# Patient Record
Sex: Male | Born: 1935 | Race: White | Hispanic: No | Smoking: Never smoker
Health system: Southern US, Community
[De-identification: ages and names within clinical notes are randomized; demographics above are authoritative.]

## PROBLEM LIST (undated history)

## (undated) DIAGNOSIS — E079 Disorder of thyroid, unspecified: Secondary | ICD-10-CM

## (undated) DIAGNOSIS — K219 Gastro-esophageal reflux disease without esophagitis: Secondary | ICD-10-CM

## (undated) DIAGNOSIS — I714 Abdominal aortic aneurysm, without rupture, unspecified: Secondary | ICD-10-CM

## (undated) DIAGNOSIS — F0280 Dementia in other diseases classified elsewhere without behavioral disturbance: Secondary | ICD-10-CM

## (undated) DIAGNOSIS — E876 Hypokalemia: Secondary | ICD-10-CM

## (undated) DIAGNOSIS — N289 Disorder of kidney and ureter, unspecified: Secondary | ICD-10-CM

## (undated) DIAGNOSIS — J189 Pneumonia, unspecified organism: Secondary | ICD-10-CM

## (undated) DIAGNOSIS — I2510 Atherosclerotic heart disease of native coronary artery without angina pectoris: Secondary | ICD-10-CM

## (undated) DIAGNOSIS — I10 Essential (primary) hypertension: Secondary | ICD-10-CM

## (undated) DIAGNOSIS — G309 Alzheimer's disease, unspecified: Secondary | ICD-10-CM

## (undated) DIAGNOSIS — N400 Enlarged prostate without lower urinary tract symptoms: Secondary | ICD-10-CM

## (undated) DIAGNOSIS — R001 Bradycardia, unspecified: Secondary | ICD-10-CM

## (undated) DIAGNOSIS — E785 Hyperlipidemia, unspecified: Secondary | ICD-10-CM

## (undated) HISTORY — PX: CORONARY ARTERY BYPASS GRAFT: SHX141

---

## 1999-06-18 ENCOUNTER — Encounter: Payer: Self-pay | Admitting: Emergency Medicine

## 1999-06-18 ENCOUNTER — Emergency Department (HOSPITAL_COMMUNITY): Admission: EM | Admit: 1999-06-18 | Discharge: 1999-06-18 | Payer: Self-pay | Admitting: Emergency Medicine

## 2003-04-05 ENCOUNTER — Emergency Department (HOSPITAL_COMMUNITY): Admission: EM | Admit: 2003-04-05 | Discharge: 2003-04-05 | Payer: Self-pay | Admitting: Emergency Medicine

## 2005-02-11 ENCOUNTER — Emergency Department (HOSPITAL_COMMUNITY): Admission: EM | Admit: 2005-02-11 | Discharge: 2005-02-11 | Payer: Self-pay | Admitting: *Deleted

## 2005-03-13 ENCOUNTER — Inpatient Hospital Stay (HOSPITAL_COMMUNITY): Admission: EM | Admit: 2005-03-13 | Discharge: 2005-03-14 | Payer: Self-pay | Admitting: Emergency Medicine

## 2005-03-26 ENCOUNTER — Ambulatory Visit: Payer: Self-pay | Admitting: *Deleted

## 2005-04-03 ENCOUNTER — Ambulatory Visit: Payer: Self-pay

## 2007-06-13 ENCOUNTER — Inpatient Hospital Stay (HOSPITAL_COMMUNITY): Admission: EM | Admit: 2007-06-13 | Discharge: 2007-06-14 | Payer: Self-pay | Admitting: *Deleted

## 2007-06-13 ENCOUNTER — Ambulatory Visit: Payer: Self-pay | Admitting: *Deleted

## 2007-06-13 ENCOUNTER — Encounter: Payer: Self-pay | Admitting: Emergency Medicine

## 2007-06-23 ENCOUNTER — Ambulatory Visit: Payer: Self-pay

## 2007-06-25 ENCOUNTER — Ambulatory Visit: Payer: Self-pay

## 2007-07-06 ENCOUNTER — Ambulatory Visit: Payer: Self-pay | Admitting: Cardiology

## 2007-12-09 ENCOUNTER — Ambulatory Visit: Payer: Self-pay | Admitting: *Deleted

## 2007-12-09 ENCOUNTER — Emergency Department (HOSPITAL_COMMUNITY): Admission: EM | Admit: 2007-12-09 | Discharge: 2007-12-09 | Payer: Self-pay | Admitting: Emergency Medicine

## 2008-02-06 ENCOUNTER — Emergency Department (HOSPITAL_COMMUNITY): Admission: EM | Admit: 2008-02-06 | Discharge: 2008-02-06 | Payer: Self-pay | Admitting: Emergency Medicine

## 2008-04-15 ENCOUNTER — Emergency Department (HOSPITAL_COMMUNITY): Admission: EM | Admit: 2008-04-15 | Discharge: 2008-04-15 | Payer: Self-pay | Admitting: Emergency Medicine

## 2008-05-24 ENCOUNTER — Inpatient Hospital Stay (HOSPITAL_COMMUNITY): Admission: EM | Admit: 2008-05-24 | Discharge: 2008-06-03 | Payer: Self-pay | Admitting: Emergency Medicine

## 2008-05-31 ENCOUNTER — Ambulatory Visit: Payer: Self-pay | Admitting: Vascular Surgery

## 2008-10-04 ENCOUNTER — Encounter (INDEPENDENT_AMBULATORY_CARE_PROVIDER_SITE_OTHER): Payer: Self-pay | Admitting: *Deleted

## 2010-04-30 IMAGING — CR DG CHEST 2V
2 series · 2 of 2 positions shown · non-contrast
Comparison: 06/13/2007 and earlier.

CLINICAL DATA: 77-year-old male with chest pain.

CHEST - 2 VIEW

[w chest pa]
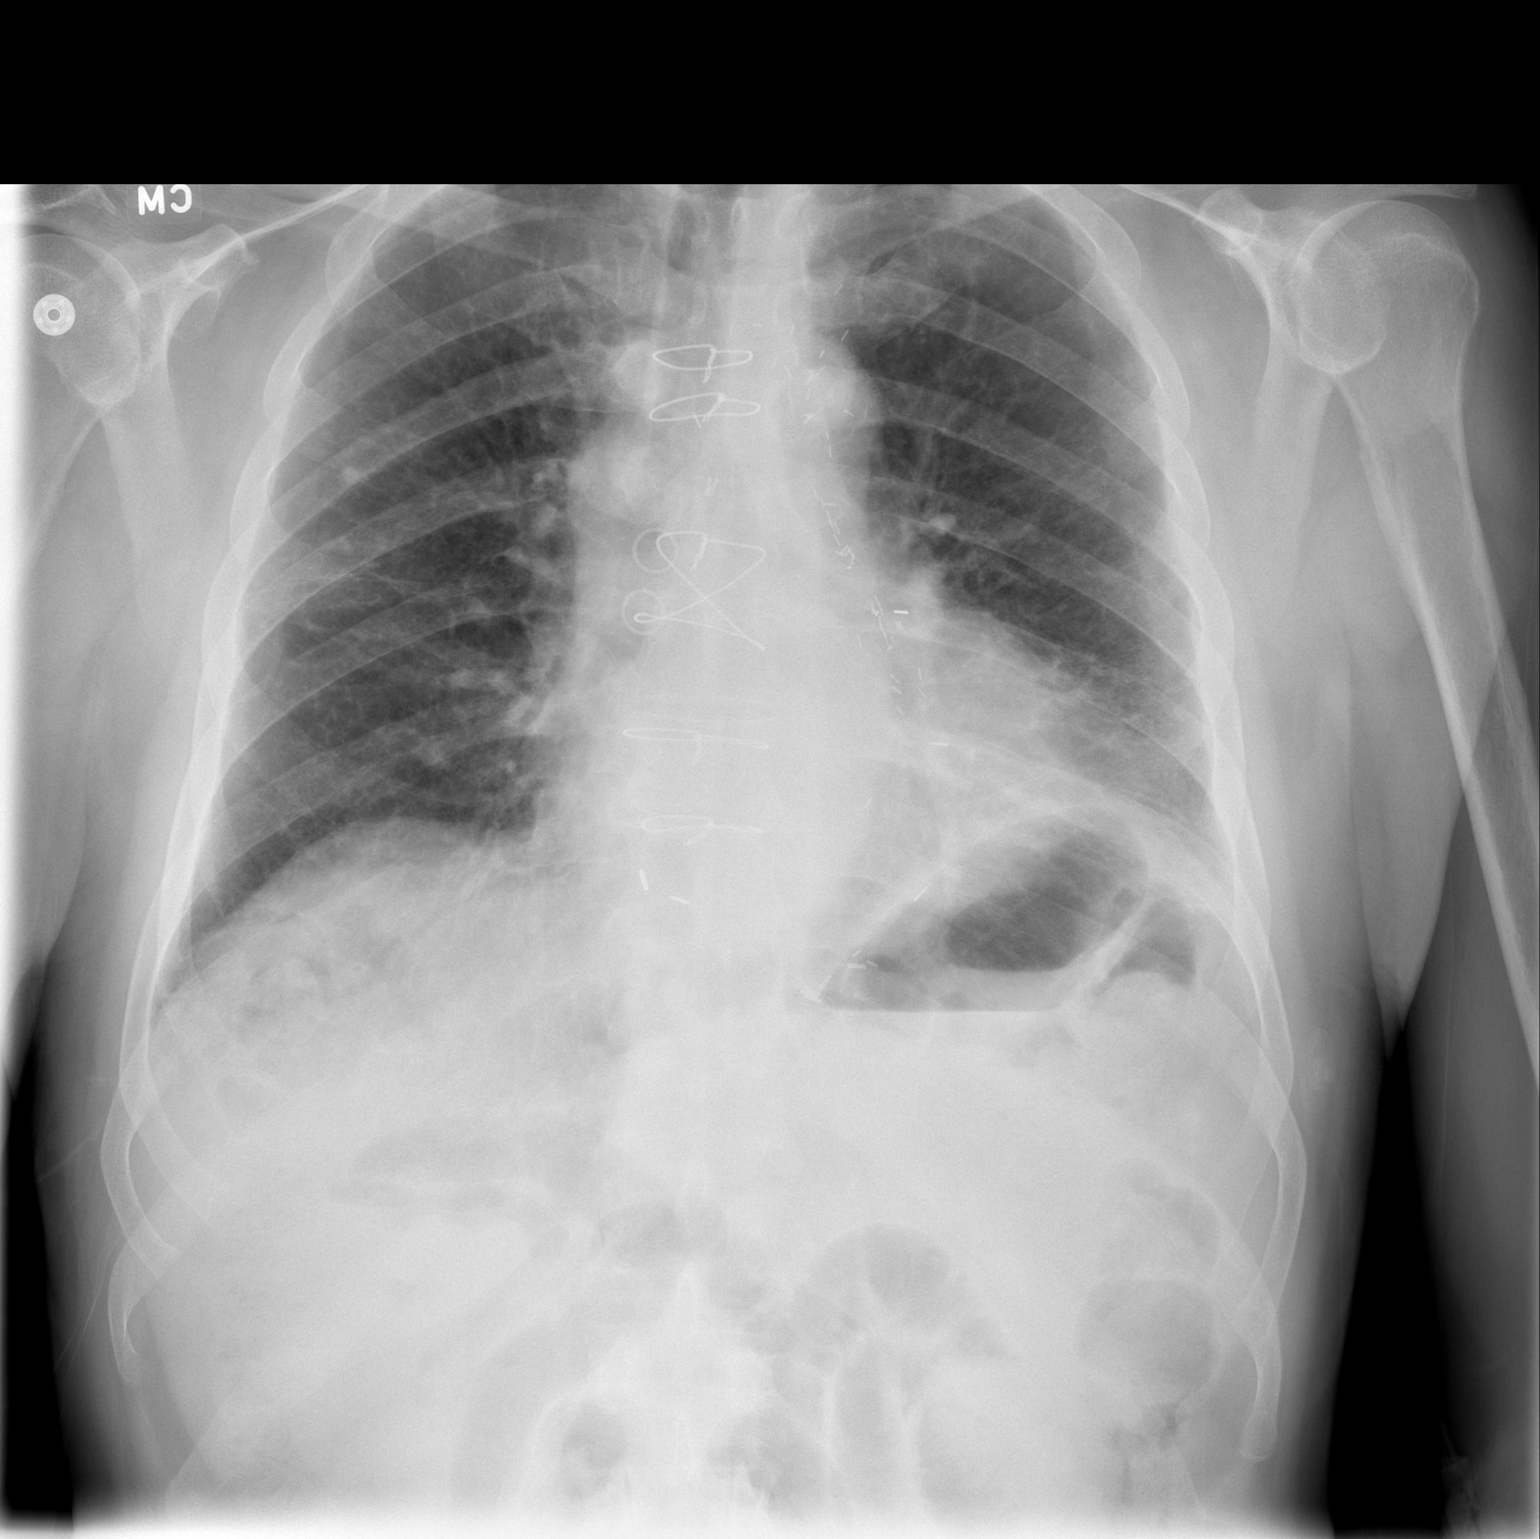

[w chest lat]
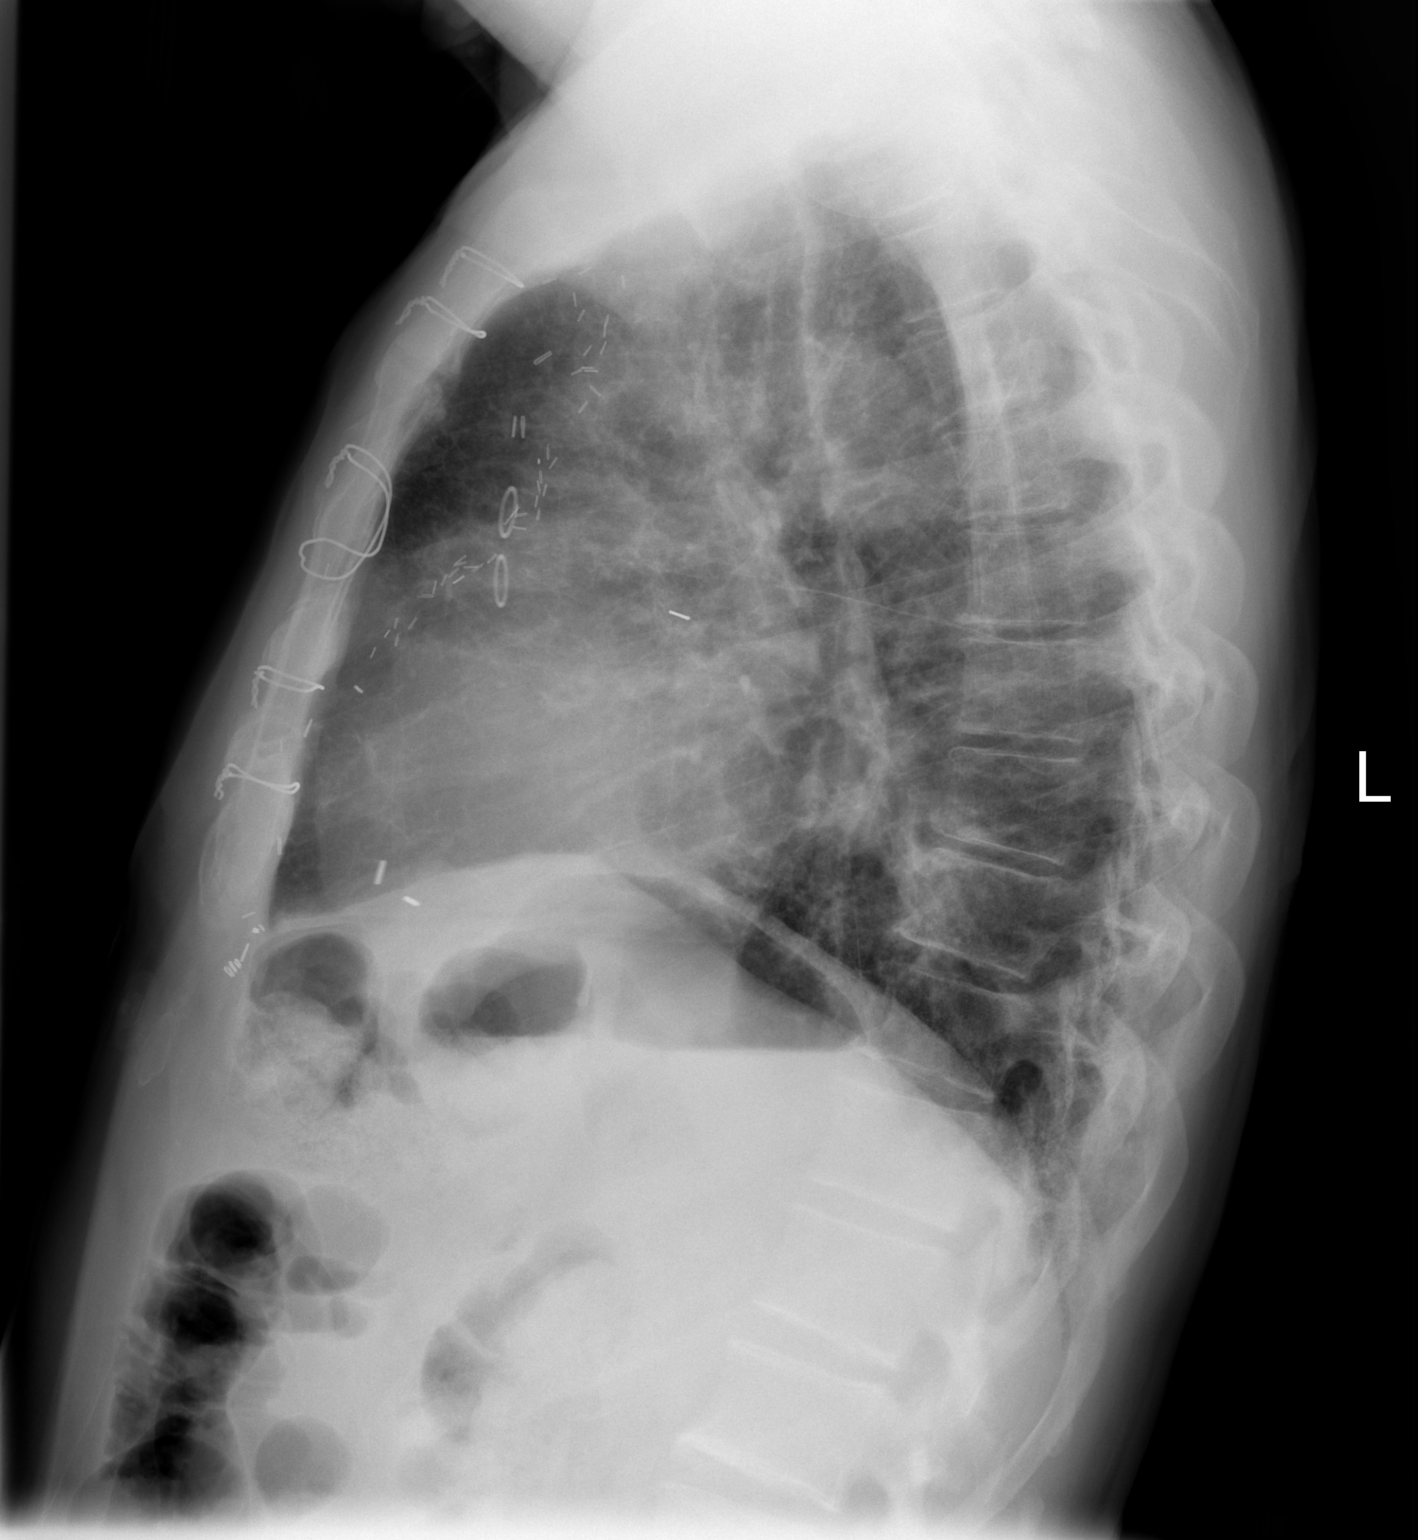

[2 of 2 positions shown; findings below may reference images not displayed]

FINDINGS: Stable sequelae of CABG.  Stable lung volumes.  Stable
cardiac size and mediastinal contours.  Cardiac size is at the
upper limits of normal.  Mild increased vascular congestion without
overt edema.  No pleural fluid, pneumothorax or confluent airspace
opacity.  Chronic atelectasis or scarring at the left lung base.
Unchanged right lung granuloma. Stable visualized osseous
structures.
IMPRESSION: 1.  Mild pulmonary vascular congestion without overt edema.
2.  Otherwise stable postoperative appearance of the chest.

## 2010-05-01 LAB — BASIC METABOLIC PANEL
BUN: 45 mg/dL — ABNORMAL HIGH (ref 6–23)
BUN: 54 mg/dL — ABNORMAL HIGH (ref 6–23)
CO2: 14 mEq/L — ABNORMAL LOW (ref 19–32)
CO2: 18 mEq/L — ABNORMAL LOW (ref 19–32)
CO2: 21 mEq/L (ref 19–32)
Calcium: 8.9 mg/dL (ref 8.4–10.5)
Chloride: 107 mEq/L (ref 96–112)
Chloride: 110 mEq/L (ref 96–112)
Chloride: 111 mEq/L (ref 96–112)
Chloride: 113 mEq/L — ABNORMAL HIGH (ref 96–112)
Creatinine, Ser: 3.06 mg/dL — ABNORMAL HIGH (ref 0.4–1.5)
GFR calc Af Amer: 24 mL/min — ABNORMAL LOW (ref >60)
GFR calc Af Amer: >60 mL/min (ref >60)
Glucose, Bld: 106 mg/dL — ABNORMAL HIGH (ref 70–99)
Glucose, Bld: 126 mg/dL — ABNORMAL HIGH (ref 70–99)
Glucose, Bld: 96 mg/dL (ref 70–99)
Potassium: 3.6 mEq/L (ref 3.5–5.1)
Potassium: 3.9 mEq/L (ref 3.5–5.1)
Potassium: 4 mEq/L (ref 3.5–5.1)
Potassium: 4.2 mEq/L (ref 3.5–5.1)
Potassium: 4.3 mEq/L (ref 3.5–5.1)
Sodium: 137 mEq/L (ref 135–145)
Sodium: 137 mEq/L (ref 135–145)
Sodium: 143 mEq/L (ref 135–145)

## 2010-05-01 LAB — CBC
HCT: 24.8 % — ABNORMAL LOW (ref 39.0–52.0)
HCT: 28.8 % — ABNORMAL LOW (ref 39.0–52.0)
Hemoglobin: 11.6 g/dL — ABNORMAL LOW (ref 13.0–17.0)
MCHC: 34.5 g/dL (ref 30.0–36.0)
MCHC: 34.7 g/dL (ref 30.0–36.0)
MCHC: 35.5 g/dL (ref 30.0–36.0)
MCHC: 35.6 g/dL (ref 30.0–36.0)
MCV: 100.2 fL — ABNORMAL HIGH (ref 78.0–100.0)
MCV: 100.4 fL — ABNORMAL HIGH (ref 78.0–100.0)
MCV: 100.4 fL — ABNORMAL HIGH (ref 78.0–100.0)
MCV: 100.4 fL — ABNORMAL HIGH (ref 78.0–100.0)
MCV: 100.8 fL — ABNORMAL HIGH (ref 78.0–100.0)
MCV: 99.9 fL (ref 78.0–100.0)
Platelets: 159 10*3/uL (ref 150–400)
Platelets: 161 10*3/uL (ref 150–400)
Platelets: 169 10*3/uL (ref 150–400)
RBC: 2.64 MIL/uL — ABNORMAL LOW (ref 4.22–5.81)
RBC: 3.08 MIL/uL — ABNORMAL LOW (ref 4.22–5.81)
RBC: 3.32 MIL/uL — ABNORMAL LOW (ref 4.22–5.81)
RDW: 12.1 % (ref 11.5–15.5)
RDW: 12.4 % (ref 11.5–15.5)
WBC: 5.7 10*3/uL (ref 4.0–10.5)
WBC: 8 10*3/uL (ref 4.0–10.5)
WBC: 8.8 10*3/uL (ref 4.0–10.5)

## 2010-05-01 LAB — GIARDIA/CRYPTOSPORIDIUM SCREEN(EIA)

## 2010-05-01 LAB — COMPREHENSIVE METABOLIC PANEL
ALT: 13 U/L (ref 0–53)
AST: 15 U/L (ref 0–37)
AST: 21 U/L (ref 0–37)
Albumin: 2.4 g/dL — ABNORMAL LOW (ref 3.5–5.2)
CO2: 18 mEq/L — ABNORMAL LOW (ref 19–32)
Calcium: 8.5 mg/dL (ref 8.4–10.5)
Calcium: 9.6 mg/dL (ref 8.4–10.5)
Chloride: 112 mEq/L (ref 96–112)
Chloride: 115 mEq/L — ABNORMAL HIGH (ref 96–112)
Creatinine, Ser: 1.34 mg/dL (ref 0.4–1.5)
GFR calc Af Amer: 16 mL/min — ABNORMAL LOW (ref >60)
GFR calc Af Amer: >60 mL/min (ref >60)
GFR calc non Af Amer: 14 mL/min — ABNORMAL LOW (ref >60)
Glucose, Bld: 99 mg/dL (ref 70–99)
Sodium: 140 mEq/L (ref 135–145)
Total Bilirubin: 0.7 mg/dL (ref 0.3–1.2)
Total Protein: 5 g/dL — ABNORMAL LOW (ref 6.0–8.3)

## 2010-05-01 LAB — RENAL FUNCTION PANEL
Albumin: 2.7 g/dL — ABNORMAL LOW (ref 3.5–5.2)
Albumin: 2.7 g/dL — ABNORMAL LOW (ref 3.5–5.2)
BUN: 21 mg/dL (ref 6–23)
BUN: 8 mg/dL (ref 6–23)
Calcium: 8.4 mg/dL (ref 8.4–10.5)
Calcium: 8.6 mg/dL (ref 8.4–10.5)
Calcium: 8.7 mg/dL (ref 8.4–10.5)
Calcium: 8.8 mg/dL (ref 8.4–10.5)
Chloride: 108 mEq/L (ref 96–112)
Chloride: 115 mEq/L — ABNORMAL HIGH (ref 96–112)
Creatinine, Ser: 1.88 mg/dL — ABNORMAL HIGH (ref 0.4–1.5)
Creatinine, Ser: 4.33 mg/dL — ABNORMAL HIGH (ref 0.4–1.5)
GFR calc Af Amer: 16 mL/min — ABNORMAL LOW (ref >60)
GFR calc non Af Amer: 11 mL/min — ABNORMAL LOW (ref >60)
GFR calc non Af Amer: 13 mL/min — ABNORMAL LOW (ref >60)
Glucose, Bld: 118 mg/dL — ABNORMAL HIGH (ref 70–99)
Glucose, Bld: 134 mg/dL — ABNORMAL HIGH (ref 70–99)
Phosphorus: 1.5 mg/dL — ABNORMAL LOW (ref 2.3–4.6)
Phosphorus: 1.7 mg/dL — ABNORMAL LOW (ref 2.3–4.6)
Potassium: 3.2 mEq/L — ABNORMAL LOW (ref 3.5–5.1)
Sodium: 137 mEq/L (ref 135–145)

## 2010-05-01 LAB — UIFE/LIGHT CHAINS/TP QN, 24-HR UR
Beta, Urine: DETECTED — AB
Free Lambda Lt Chains,Ur: 1.55 mg/dL — ABNORMAL HIGH (ref 0.08–1.01)
Free Lt Chn Excr Rate: 215.48 mg/d
Gamma Globulin, Urine: DETECTED — AB
Time: 24 hours
Total Protein, Urine-Ur/day: 482 mg/d — ABNORMAL HIGH (ref 10–140)

## 2010-05-01 LAB — URINALYSIS, ROUTINE W REFLEX MICROSCOPIC
Glucose, UA: NEGATIVE mg/dL
Ketones, ur: NEGATIVE mg/dL
Leukocytes, UA: NEGATIVE
pH: 5.5 (ref 5.0–8.0)

## 2010-05-01 LAB — CK TOTAL AND CKMB (NOT AT ARMC)
CK, MB: 1.6 ng/mL (ref 0.3–4.0)
CK, MB: 3.5 ng/mL (ref 0.3–4.0)
Relative Index: 2.2 (ref 0.0–2.5)
Relative Index: INVALID (ref 0.0–2.5)
Total CK: 162 U/L (ref 7–232)
Total CK: 51 U/L (ref 7–232)

## 2010-05-01 LAB — PROTEIN ELECTROPH W RFLX QUANT IMMUNOGLOBULINS
Albumin ELP: 56.9 % (ref 55.8–66.1)
Beta 2: 4.8 % (ref 3.2–6.5)
Gamma Globulin: 15.5 % (ref 11.1–18.8)

## 2010-05-01 LAB — POCT CARDIAC MARKERS
Myoglobin, poc: 244 ng/mL (ref 12–200)
Troponin i, poc: <0.05 ng/mL (ref 0.00–0.09)

## 2010-05-01 LAB — DIFFERENTIAL
Basophils Absolute: 0 10*3/uL (ref 0.0–0.1)
Basophils Relative: 0 % (ref 0–1)
Eosinophils Absolute: 0.2 10*3/uL (ref 0.0–0.7)
Eosinophils Absolute: 0.2 10*3/uL (ref 0.0–0.7)
Eosinophils Relative: 2 % (ref 0–5)
Lymphs Abs: 2 10*3/uL (ref 0.7–4.0)
Monocytes Relative: 9 % (ref 3–12)
Neutrophils Relative %: 63 % (ref 43–77)
Neutrophils Relative %: 73 % (ref 43–77)

## 2010-05-01 LAB — EHEC TOXIN BY EIA, STOOL

## 2010-05-01 LAB — TSH
TSH: 0.177 u[IU]/mL — ABNORMAL LOW (ref 0.350–4.500)
TSH: 0.445 u[IU]/mL (ref 0.350–4.500)

## 2010-05-01 LAB — URINE MICROSCOPIC-ADD ON

## 2010-05-01 LAB — CLOSTRIDIUM DIFFICILE EIA

## 2010-05-01 LAB — URINE CULTURE: Colony Count: NO GROWTH

## 2010-05-01 LAB — VITAMIN B12: Vitamin B-12: 749 pg/mL (ref 211–911)

## 2010-05-01 LAB — TROPONIN I: Troponin I: 0.03 ng/mL (ref 0.00–0.06)

## 2010-05-01 LAB — BRAIN NATRIURETIC PEPTIDE: Pro B Natriuretic peptide (BNP): 531 pg/mL — ABNORMAL HIGH (ref 0.0–100.0)

## 2010-05-01 LAB — FOLATE: Folate: 18 ng/mL

## 2010-05-01 LAB — STOOL CULTURE

## 2010-05-03 ENCOUNTER — Inpatient Hospital Stay (HOSPITAL_COMMUNITY)
Admission: AD | Admit: 2010-05-03 | Discharge: 2010-05-07 | DRG: 689 | Disposition: A | Discharge status: Nursing Home (Includes ICF) | Payer: Medicare Other | Source: Other Acute Inpatient Hospital | Attending: Internal Medicine | Admitting: Internal Medicine

## 2010-05-03 ENCOUNTER — Emergency Department (HOSPITAL_BASED_OUTPATIENT_CLINIC_OR_DEPARTMENT_OTHER)
Admission: EM | Admit: 2010-05-03 | Discharge: 2010-05-03 | Disposition: A | Discharge status: Nursing Home (Includes ICF) | Payer: Medicare Other | Source: Home / Self Care | Attending: Emergency Medicine | Admitting: Emergency Medicine

## 2010-05-03 ENCOUNTER — Emergency Department (INDEPENDENT_AMBULATORY_CARE_PROVIDER_SITE_OTHER): Payer: Medicare Other

## 2010-05-03 DIAGNOSIS — I2510 Atherosclerotic heart disease of native coronary artery without angina pectoris: Secondary | ICD-10-CM | POA: Diagnosis present

## 2010-05-03 DIAGNOSIS — R509 Fever, unspecified: Secondary | ICD-10-CM

## 2010-05-03 DIAGNOSIS — Z951 Presence of aortocoronary bypass graft: Secondary | ICD-10-CM | POA: Insufficient documentation

## 2010-05-03 DIAGNOSIS — I10 Essential (primary) hypertension: Secondary | ICD-10-CM | POA: Insufficient documentation

## 2010-05-03 DIAGNOSIS — E876 Hypokalemia: Secondary | ICD-10-CM | POA: Diagnosis present

## 2010-05-03 DIAGNOSIS — M1990 Unspecified osteoarthritis, unspecified site: Secondary | ICD-10-CM | POA: Diagnosis present

## 2010-05-03 DIAGNOSIS — N390 Urinary tract infection, site not specified: Principal | ICD-10-CM | POA: Diagnosis present

## 2010-05-03 DIAGNOSIS — I252 Old myocardial infarction: Secondary | ICD-10-CM | POA: Insufficient documentation

## 2010-05-03 DIAGNOSIS — E860 Dehydration: Secondary | ICD-10-CM | POA: Diagnosis present

## 2010-05-03 DIAGNOSIS — G309 Alzheimer's disease, unspecified: Secondary | ICD-10-CM | POA: Insufficient documentation

## 2010-05-03 DIAGNOSIS — F068 Other specified mental disorders due to known physiological condition: Secondary | ICD-10-CM | POA: Diagnosis present

## 2010-05-03 DIAGNOSIS — F0280 Dementia in other diseases classified elsewhere without behavioral disturbance: Secondary | ICD-10-CM | POA: Insufficient documentation

## 2010-05-03 DIAGNOSIS — N289 Disorder of kidney and ureter, unspecified: Secondary | ICD-10-CM | POA: Diagnosis present

## 2010-05-03 DIAGNOSIS — I739 Peripheral vascular disease, unspecified: Secondary | ICD-10-CM | POA: Diagnosis present

## 2010-05-03 DIAGNOSIS — J189 Pneumonia, unspecified organism: Secondary | ICD-10-CM | POA: Diagnosis present

## 2010-05-03 DIAGNOSIS — A498 Other bacterial infections of unspecified site: Secondary | ICD-10-CM | POA: Diagnosis present

## 2010-05-03 DIAGNOSIS — R05 Cough: Secondary | ICD-10-CM

## 2010-05-03 DIAGNOSIS — E785 Hyperlipidemia, unspecified: Secondary | ICD-10-CM | POA: Diagnosis present

## 2010-05-03 LAB — DIFFERENTIAL
Eosinophils Absolute: 0.2 10*3/uL (ref 0.0–0.7)
Eosinophils Relative: 2 % (ref 0–5)
Lymphocytes Relative: 24 % (ref 12–46)
Lymphocytes Relative: 7 % — ABNORMAL LOW (ref 12–46)
Lymphs Abs: 2.6 10*3/uL (ref 0.7–4.0)
Lymphs Abs: 0.8 10*3/uL (ref 0.7–4.0)
Monocytes Absolute: 0.8 10*3/uL (ref 0.1–1.0)
Monocytes Relative: 7 % (ref 3–12)
Monocytes Relative: 8 % (ref 3–12)
Neutro Abs: 8.8 10*3/uL — ABNORMAL HIGH (ref 1.7–7.7)
Neutrophils Relative %: 84 % — ABNORMAL HIGH (ref 43–77)

## 2010-05-03 LAB — CBC
HCT: 39.1 % (ref 39.0–52.0)
Hemoglobin: 13.2 g/dL (ref 13.0–17.0)
MCH: 33.2 pg (ref 26.0–34.0)
MCHC: 35.2 g/dL (ref 30.0–36.0)
MCV: 98.5 fL (ref 78.0–100.0)
Platelets: 219 10*3/uL (ref 150–400)
Platelets: 182 10*3/uL (ref 150–400)
RBC: 4.41 MIL/uL (ref 4.22–5.81)
RBC: 3.97 MIL/uL — ABNORMAL LOW (ref 4.22–5.81)
WBC: 10.9 10*3/uL — ABNORMAL HIGH (ref 4.0–10.5)
WBC: 10.5 10*3/uL (ref 4.0–10.5)

## 2010-05-03 LAB — COMPREHENSIVE METABOLIC PANEL
ALT: 36 U/L (ref 0–53)
AST: 35 U/L (ref 0–37)
Albumin: 4.8 g/dL (ref 3.5–5.2)
CO2: 26 mEq/L (ref 19–32)
Calcium: 10.2 mg/dL (ref 8.4–10.5)
Chloride: 100 mEq/L (ref 96–112)
GFR calc Af Amer: 41 mL/min — ABNORMAL LOW (ref >60)
GFR calc non Af Amer: 34 mL/min — ABNORMAL LOW (ref >60)
Sodium: 135 mEq/L (ref 135–145)

## 2010-05-03 LAB — URINALYSIS, ROUTINE W REFLEX MICROSCOPIC
Glucose, UA: NEGATIVE mg/dL
Glucose, UA: NEGATIVE mg/dL
Ketones, ur: 15 mg/dL — AB
Leukocytes, UA: NEGATIVE
Nitrite: NEGATIVE
Protein, ur: NEGATIVE mg/dL
pH: 6.5 (ref 5.0–8.0)
pH: 7.5 (ref 5.0–8.0)

## 2010-05-03 LAB — MRSA PCR SCREENING: MRSA by PCR: NEGATIVE

## 2010-05-03 LAB — BASIC METABOLIC PANEL
BUN: 13 mg/dL (ref 6–23)
CO2: 28 mEq/L (ref 19–32)
Chloride: 105 mEq/L (ref 96–112)
Creatinine, Ser: 1.5 mg/dL (ref 0.4–1.5)
Potassium: 4.6 mEq/L (ref 3.5–5.1)

## 2010-05-03 LAB — URINE MICROSCOPIC-ADD ON

## 2010-05-03 LAB — POCT CARDIAC MARKERS: Troponin i, poc: <0.05 ng/mL (ref 0.00–0.09)

## 2010-05-04 LAB — CBC
Hemoglobin: 12.1 g/dL — ABNORMAL LOW (ref 13.0–17.0)
MCH: 32.7 pg (ref 26.0–34.0)
MCHC: 32.8 g/dL (ref 30.0–36.0)
Platelets: 156 10*3/uL (ref 150–400)
RBC: 3.7 MIL/uL — ABNORMAL LOW (ref 4.22–5.81)

## 2010-05-04 LAB — COMPREHENSIVE METABOLIC PANEL
ALT: 13 U/L (ref 0–53)
AST: 24 U/L (ref 0–37)
Albumin: 3.1 g/dL — ABNORMAL LOW (ref 3.5–5.2)
Calcium: 9 mg/dL (ref 8.4–10.5)
Creatinine, Ser: 1.39 mg/dL (ref 0.4–1.5)
GFR calc Af Amer: 60 mL/min — ABNORMAL LOW (ref >60)
Sodium: 139 mEq/L (ref 135–145)

## 2010-05-05 LAB — URINE CULTURE
Colony Count: >=100000
Culture  Setup Time: 201204130056

## 2010-05-05 NOTE — H&P (Signed)
NAME:  Wyatt Rodriguez, Wyatt Rodriguez               ACCOUNT NO.:  [id number]  MEDICAL RECORD NO.:  [id number]           PATIENT TYPE:  I  LOCATION:  1515                         FACILITY:  [facility]  PHYSICIAN:  Vania Rea, M.D. DATE OF BIRTH:  1930/02/09  DATE OF ADMISSION:  05/03/2010 DATE OF DISCHARGE:                             HISTORY & PHYSICAL   PRIMARY CARE PHYSICIAN:  Dr. Florentina Jenny  CHIEF COMPLAINT:  Fever for 2 days.  HISTORY OF PRESENT ILLNESS:  This is a 77 year old Caucasian gentleman, a resident of an assisted living facility, who has a history of dementia but usually ambulates without assistance and is brought to the freestanding emergency room at [facility] with complaints of fever.  No other specific complaints are made.  The patient is having a scant cough.  Denies frequency or dysuria.  Denies nausea, vomiting, diarrhea. Denies dizziness or syncope.  His temperature was recorded as high as 103, responded to Tylenol.  On evaluation at the [facility], he was found to be having urinalysis suggestive of urinary tract infection and he was transferred to the hospitalist service of [facility] for further management.  PAST MEDICAL HISTORY: 1. Dementia. 2. Coronary artery disease status post CABG, status post AAA repair. 3. Hypertension. 4. Osteoarthritis. 5. Hyperlipidemia.  MEDICATIONS: 1. Acetaminophen 650 mg every 8 hours as needed. 2. Loperamide 2 mg every 3 hours as needed. 3. Trazodone 50 mg daily at bedtime. 4. Simvastatin 40 mg daily at bedtime. 5. Aricept 10 mg at bedtime. 6. Vitamin D 1000 units daily. 7. Pentoxifylline 400 mg daily. 8. Multivitamins 1 daily. 9. Metoprolol 12.5 mg daily. 10.Potassium chloride 10 mEq daily. 11.Finasteride 5 mg daily. 12.Aspirin 81 mg daily.  ALLERGIES:  To ACE INHIBITORS which cause hypertension.  SOCIAL HISTORY:  He is divorced.  No history of tobacco, alcohol or illicit drug.  FAMILY HISTORY:  He has a  family history of cardiac disease.  Other than that, family history is unknown.  REVIEW OF SYSTEMS:  Other than noted above, unable to obtain and unreliable secondary to the patient's dementia.  PHYSICAL EXAMINATION:  GENERAL:  Pleasant and well-nourished elderly Caucasian gentleman, lying flat in bed, in no distress. VITAL SIGNS:  His temperature is now 100, pulse 99, respirations 20, blood pressure 94/53, saturating at 97% on room air. HEENT:  Pupils are round and equal.  Mucous membranes pink.  Anicteric. He is not dehydrated.  No cervical lymphadenopathy or thyromegaly.  No carotid bruits. CHEST:  Clear to auscultation bilaterally. CARDIOVASCULAR SYSTEM:  Regular rhythm.  No murmur heard. ABDOMEN:  Soft, nontender.  No masses. EXTREMITIES:  He has arthritic deformities of the hands and knees but there is no edema.  Muscle tone and bulk is good. SKIN:  Warm and sweaty. CENTRAL NERVOUS SYSTEM:  Cranial nerves II through XII are grossly intact.  He has no focal lateralizing signs.  LABORATORY DATA:  His white count is 10.5, hemoglobin 13.2, platelets 182.  Absolute neutrophil count is 8.8.  His sodium is 143, potassium 4.6, chloride 105, CO2 of 28, glucose elevated to 133, BUN 13, creatinine 1.5, calcium 9.4.  Urinalysis shows cloudy urine with specific gravity of 1.020, 15 ketones, large amount of blood, negative for proteins, moderate amount of leukocyte esterase.  Urine microscopy shows 7 to 10 white cells, 11 to 20 red cells, and a few bacteria.  His portable chest x-ray shows more prominent interstitial markings superimposed on chronic interstitial lung disease, cannot exclude viral pneumonia or interstitial edema, stable cardiomegaly.  ASSESSMENT: 1. Fever with abnormal chest x-ray, questionable viral pneumonia. 2. Probable urinary tract infection. 3. Coronary artery disease. 4. History of moderate peripheral vascular disease. 5. Hypertension. 6. Possible mild  dehydration.  PLAN:  We will admit this gentleman for hydration.  We will get urine and blood cultures and empirically start Levaquin for his urine and his possible pneumonia.  We will keep him on telemetry because of his borderline blood pressures but we do not feel he is in any way septic or likely to become septic but may need to upgrade his status if he does not respond promptly to these measures.  Treatment will be modified as further information becomes available.     Vania Rea, M.D.     LC/MEDQ  D:  05/03/2010  T:  05/04/2010  Job:  [id number]  cc:   Dr. Florentina Jenny  Electronically Signed by Vania Rea M.D. on 05/05/2010 03:06:43 AM

## 2010-05-06 LAB — CBC
HCT: 35.5 % — ABNORMAL LOW (ref 39.0–52.0)
MCHC: 33.2 g/dL (ref 30.0–36.0)
Platelets: 146 10*3/uL — ABNORMAL LOW (ref 150–400)
RDW: 12.7 % (ref 11.5–15.5)

## 2010-05-06 LAB — BASIC METABOLIC PANEL
Calcium: 9 mg/dL (ref 8.4–10.5)
GFR calc non Af Amer: 49 mL/min — ABNORMAL LOW (ref >60)
Glucose, Bld: 92 mg/dL (ref 70–99)
Sodium: 139 mEq/L (ref 135–145)

## 2010-05-07 LAB — CBC
Hemoglobin: 11.5 g/dL — ABNORMAL LOW (ref 13.0–17.0)
MCHC: 33 g/dL (ref 30.0–36.0)
Platelets: 250 10*3/uL (ref 150–400)
RBC: 4.15 MIL/uL — ABNORMAL LOW (ref 4.22–5.81)
RDW: 12.7 % (ref 11.5–15.5)
WBC: 4.7 10*3/uL (ref 4.0–10.5)
WBC: 9.3 10*3/uL (ref 4.0–10.5)

## 2010-05-07 LAB — POCT CARDIAC MARKERS
CKMB, poc: <1 ng/mL — ABNORMAL LOW (ref 1.0–8.0)
Myoglobin, poc: 103 ng/mL (ref 12–200)
Myoglobin, poc: 82.6 ng/mL (ref 12–200)

## 2010-05-07 LAB — DIFFERENTIAL
Lymphocytes Relative: 39 % (ref 12–46)
Lymphs Abs: 3.6 10*3/uL (ref 0.7–4.0)
Monocytes Relative: 8 % (ref 3–12)
Neutro Abs: 4.4 10*3/uL (ref 1.7–7.7)
Neutrophils Relative %: 48 % (ref 43–77)

## 2010-05-07 LAB — BASIC METABOLIC PANEL
BUN: 18 mg/dL (ref 6–23)
Calcium: 9 mg/dL (ref 8.4–10.5)
Calcium: 9.7 mg/dL (ref 8.4–10.5)
Chloride: 108 mEq/L (ref 96–112)
Chloride: 108 mEq/L (ref 96–112)
Creatinine, Ser: 1.37 mg/dL (ref 0.4–1.5)
Creatinine, Ser: 1.45 mg/dL (ref 0.4–1.5)
GFR calc Af Amer: >60 mL/min (ref >60)
GFR calc Af Amer: 57 mL/min — ABNORMAL LOW (ref >60)
GFR calc non Af Amer: 50 mL/min — ABNORMAL LOW (ref >60)
GFR calc non Af Amer: 47 mL/min — ABNORMAL LOW (ref >60)

## 2010-05-07 LAB — CLOSTRIDIUM DIFFICILE BY PCR: Toxigenic C. Difficile by PCR: NEGATIVE

## 2010-05-08 NOTE — Discharge Summary (Signed)
  NAMEBRONDON, Wyatt Rodriguez               ACCOUNT NO.:  [id number]  MEDICAL RECORD NO.:  [id number]           PATIENT TYPE:  I  LOCATION:  1515                         FACILITY:  [facility]  PHYSICIAN:  Conley Canal, MD      DATE OF BIRTH:  Aug 19, 1930  DATE OF ADMISSION:  05/03/2010 DATE OF DISCHARGE:  05/07/2010                              DISCHARGE SUMMARY   PRIMARY CARE PHYSICIAN:  Dr. Florentina Rodriguez.  DISCHARGE DIAGNOSES: 1. E coli urinary tract infection. 2. Typical pneumonia. 3. Mild dementia. 4. Coronary artery disease, status post CABG, status post abdominal     aortic aneurysm repair. 5. Hypertension. 6. Osteoarthritis. 7. Hyperlipidemia. 8. Peripheral vascular disease. 9. Hypokalemia, which is chronic.  DISCHARGE MEDICATIONS: 1. Keflex 500 mg three times daily for four more days. 2. Aspirin 81 mg daily. 3. Aricept 10 mg at bedtime. 4. Finasteride 5 mg daily. 5. Loperamide 2 mg every 3 hours as needed for loose stool. 6. Lopressor 12.5 mg daily. 7. Multivitamins 1 tablet daily. 8. Pentoxifylline 400 mg daily. 9. KCl 10 mEq daily. 10.Simvastatin 40 mg daily at bedtime. 11.Trazodone 50 mg at bedtime. 12.Tylenol 650 mg every 8 hours as needed. 13.Vitamin D3 1000 units daily.  PROCEDURES PERFORMED:  Chest x-ray on May 03, 2010 showed more prominent interstitial markings superimposed on chronic interstitial lung disease suggesting possible viral pneumonia versus interstitial edema.  It also showed stable cardiomegaly.  HOSPITAL COURSE:  Wyatt Rodriguez is a pleasant 77 year old male who was admitted with history of fever ongoing for 2 days prior to the admission.  He also had cough productive of scant sputum.  He was found to have E coli urinary tract infection.  The E coli was resistant to ampicillin, ciprofloxacin, Levaquin, but sensitive to Bactrim, tobramycin, nitrofurantoin as well as ceftriaxone and cefazolin. Initially, the patient was placed on ciprofloxacin, but was  later on switched to Keflex and he should complete four more days of Keflex.  He has since defervesced and today labs are significant for WBC 4.7, hemoglobin 11.5, hematocrit 34.8, platelet count 154.  Sodium 141, potassium 3.3, BUN 14, creatinine 1.37.  Blood cultures were negative x2.  He is discharged in stable condition back to assisted-living facility.  The time spent for this discharge preparation is less than 30 minutes.     Conley Canal, MD     SR/MEDQ  D:  05/07/2010  T:  05/07/2010  Job:  [id number]  cc:   Wyatt Jenny, MD  Electronically Signed by Conley Canal  on 05/08/2010 09:14:50 PM

## 2010-05-10 LAB — CULTURE, BLOOD (ROUTINE X 2)
Culture  Setup Time: 201204130830
Culture  Setup Time: 201204130830
Culture: NO GROWTH

## 2010-06-05 NOTE — Consult Note (Signed)
NAMEFRANK, Wyatt Rodriguez               ACCOUNT NO.:  [id number]   MEDICAL RECORD NO.:  [id number]          PATIENT TYPE:  INP   LOCATION:  6714                         FACILITY:  [facility]   PHYSICIAN:  Maree Krabbe, M.D.DATE OF BIRTH:  02-Dec-1930   DATE OF CONSULTATION:  05/28/2008  DATE OF DISCHARGE:                                 CONSULTATION   CHIEF COMPLAINT:  Acute on chronic renal failure.   HISTORY OF PRESENT ILLNESS:  This is a 77 year old male presenting with  diarrhea, dehydration, and acute on chronic renal failure in the setting  of an ACE inhibitor use and diarrhea for 5 days.  He has been in the  hospital for 4 days at this time.  He has had diarrhea for 5 days, which  has currently resolved.  Stools studies were negative for C. diff,  Shigella toxin 1 and 2, as well as cultures of Salmonella, Shigella,  Campylobacter, and Yersinia.  It is also negative for Giardia and  Cryptosporidium.  Urine on presentation had no protein and trace blood  with micro showing granular and hyaline casts.  BUN on presentation was  58, creatinine on presentation was 4.23.  The patient's baseline  creatinine was 1.30 as of November 2009, 1.45 as of January 2010, and  1.92 as of March 2010.  Since admission, the patient's creatinine is  trended from 4.29 on presentation to 3.72, then 3.06, then 2.96, then  3.24, and jumped up today to 5.30 at which time, the Renal Service was  consulted.  There is no urine output recorded during this admission, and  the patient is demented and cannot state whether or not, he is  urinating.  Witnessing the patient's p.o. intake over the course of 2  hours during meal time indicates, he does not seem to be drinking very  much either.  Bladder scan at the time secondary to distended abdomen  revealed 1000 mL in the bladder.   PAST MEDICAL HISTORY:  1. Acute renal failure in the setting of dehydration and ACE use in      2007.  2. Coronary artery disease with  MI, status post CABG in 1997.  3. Hypertension.  4. BPH.  5. Osteoarthritis.  6. Dementia.  7. Peripheral vascular disease.   SOCIAL HISTORY:  The patient lives alone in [place].  He is retired.  He has 4 children and he is divorced.  He has a distant tobacco history  and no alcohol or illicit drug use.   FAMILY HISTORY:  Mother is deceased from unknown causes.  Father is  deceased from heart disease.   REVIEW OF SYSTEMS:  Cannot obtain at this time secondary to the  patient's dementia.   ALLERGIES:  The patient has an allergy listed to an ACE INHIBITOR,  though of note, he is on lisinopril at home.   MEDICATIONS:  In the hospital, the patient is on:  1. Galantamine 4 mg p.o. nightly.  2. Heparin 5000 units subcu q.8 h.  3. Metoprolol 12.5 mg p.o. b.i.d.  4. Metronidazole 500 mg p.o. t.i.d.  5. Moxifloxacin 400 mg  IV daily.  6. Pantoprazole 40 mg p.o. daily.  7. Pentoxifylline 400 mg p.o. daily.  8. Simvastatin 80 mg p.o. nightly.   He also has the following p.r.n. medications, acetaminophen, albuterol,  Robitussin, Atrovent, Maalox, Zofran, and oxycodone.  In addition, he is  on the following medications at home which are held at this time.  1. Lisinopril 40 mg p.o. daily.  2. Hydrochlorothiazide 12.5 mg p.o. daily.   PHYSICAL EXAMINATION:  VITAL SIGNS:  Temperature is 98.1, pulse 57-94,  respirations 15-18, and blood pressure 105-120/58-67.  GENERAL:  The patient is alert and oriented to person and place only.  He has marked singultus.  HEENT:  Normocephalic and atraumatic.  NECK:  No JVD.  CARDIOVASCULAR:  Regular rate and rhythm with no murmurs, rubs, or  gallops.  Normal PMI.  Pulses are 2+ and equal without bruits.  LUNGS:  Clear to auscultation with normal work of breathing and no  crackles.  ABDOMEN:  Firm and distended, but not tender.  EXTREMITIES:  Bilateral edema 1+.  NEUROLOGIC:  Cranial nerves II-XII are grossly intact, but the patient  has intention  and resting tremor.   Chest x-ray shows no acute cardiovascular process.  CT of the abdomen  and pelvis shows small kidneys, a 5-cm AAA liver mass which is a cyst  versus hemangioma, and an enlarged prostate gland.   LABORATORY DATA:  White blood count 10.4, hemoglobin 10.2, hematocrit  28.8, and platelets 161.  Sodium 134, potassium 4.4, chloride 108,  bicarb 14, BUN 53, creatinine 5.20, glucose 118, calcium 8.8, phos 2.7,  and albumin 3.4.   ASSESSMENT AND PLAN:  This is a 77 year old male with acute on chronic  renal failure.  1. Acute on chronic renal failure.  The patient initially presented      with renal failure secondary to hypovolemia in the setting of ACE      inhibitor use.  It is not resolving at this time, and given the      history of BPH as well as uncertain urinary output, and the firm      abdomen, a bladder scan was ordered, showed 1000 mL in the bladder.      Foley was placed and 1800 mL were returned.  We will keep the      patient on strict Is and Os.  We will recommend a Urology consult      at this time and follow the patient's creatinine.  Continue to hold      the ACE inhibitor.  We would also hold the Trental until renal      function returns as its active metabolite may accumulate and      increase GI side effects, though this is not a nephrotoxic agent.      We will also evaluate causes of chronic renal failure with an      SPEP/UPEP, and ideally we would look at renovascular status, but      will not pursue at this      time.  2. Singultus.  This is likely secondary to uremia.  This should      resolve as the renal failure returns.  3. Metabolic acidosis also secondary to acute renal failure should      resolve as renal failure normalizes.      Romero Belling, MD  Electronically Signed      Maree Krabbe, M.D.  Electronically Signed    MO/MEDQ  D:  05/28/2008  T:  05/29/2008  Job:  [id number]

## 2010-06-05 NOTE — H&P (Signed)
Wyatt Rodriguez, Wyatt Rodriguez               ACCOUNT NO.:  [id number]   MEDICAL RECORD NO.:  [id number]          PATIENT TYPE:  OBV   LOCATION:  1828                         FACILITY:  [facility]   PHYSICIAN:  Audery Amel, MD    DATE OF BIRTH:  [birth year]   DATE OF ADMISSION:  12/09/2007  DATE OF DISCHARGE:                              HISTORY & PHYSICAL   PRIMARY CARDIOLOGIST:  Dr. Jens Som.   CHIEF COMPLAINT:  Chest pain.   HISTORY OF PRESENT ILLNESS:  Wyatt Rodriguez is a 77 year old white male with  a history of coronary artery disease status post CABG (unknown grafts)  presenting with chest pain.  The patient states this pain started this  evening while watching TV.  He stated the pain was not bad enough to  take nitroglycerin and therefore he went to bed.  He states that he  noted that the pain seemed to be improved when he was laying on either  side but does not recall any specific precipitating or alleviating  factors.  He denies any associated shortness of breath, dyspnea on  exertion, nausea, vomiting, or palpitations.  He denies orthopnea, PND,  lower extremity edema or recent weight gain.  The pain continued to  persist and therefore he became concerned, presented to the emergency  department for evaluation.  On presentation, his chest pain had  resolved.  His initial EKG reveals normal sinus rhythm with a  nonspecific IVCD which is unchanged compared to his prior studies.  His  initial cardiac biomarkers are negative x1.  On my initial evaluation,  the patient was asymptomatic and otherwise hemodynamically stable  without complaints.   PAST MEDICAL HISTORY:  1. CAD status post CABG (grafts unknown).  2. Hypertension.  3. BPH.  4. Osteoarthritis.   ALLERGIES:  No known drug allergies.   MEDICATIONS:  The patient is unclear of his current medications,  however, and referencing the last clinic note, he is on:  1. Aspirin 81 mg daily.  2. Zocor 80 mg daily (questionable).  3.  Metoprolol 25 mg daily.   SOCIAL HISTORY:  The patient lives in [place] alone.  He is retired.  He denies any tobacco, alcohol or illicit substance.   FAMILY HISTORY:  Noncontributory.   REVIEW OF SYSTEMS:  As per HPI, otherwise complete review of systems  will stand as negative except as documented.   PHYSICAL EXAMINATION:  Blood pressure 130/67.  Heart rate is 51.  O2  sats are 98% on 2 L nasal cannula.  GENERAL:  The patient is alert and oriented x3, no acute distress,  pleasantly conversant.  HEENT:  Normocephalic, atraumatic, EOMI, PERL, nares patent, OMP is  clear without erythema or exudate.  NECK:  Supple, full range of motion, no appreciable JVD.  Carotid  upstrokes are equal and symmetric bilaterally.  No audible bruits.  There is no palpable thyromegaly or lymphadenopathy.  CARDIOVASCULAR:  No S1, S2 with no audible murmurs, rubs or gallops.  PMI is nondisplaced in the midclavicular line.  He has a well-healed  median sternotomy scar.  LUNGS:  Clear to auscultation bilaterally.  SKIN:  Warm, dry and intact.  There are no rashes or lesions noted.  ABDOMEN:  Soft, nontender, nondistended, positive bowel sounds.  No  hepatosplenomegaly.  EXTREMITIES:  Reveal no clubbing, cyanosis or edema.  MUSCULOSKELETAL:  No joint deformity or effusions are noted.  NEUROLOGIC:  Grossly nonfocal with intact strength and sensation.   EKG my interpretation reveals normal sinus rhythm with a nonspecific  IVCD.  There is no evidence of acute injury or ischemia.   LABS:  White count 6.8, hematocrit 40, platelets 185.  Sodium 140,  potassium 3.2, chloride 107, CO2 is 27, BUN 14, creatinine 1.3, glucose  106, CK-MB less than 1.0, troponin-I less than 0.05.   IMPRESSION:  1. Chest pain rule out myocardial infarction.  2. Coronary artery disease.  3. Hypertension.  4. Hyperlipidemia.  5. Hypokalemia.   PLAN:  We will plan to admit the patient to observation.  The patient  has a known  history of coronary artery disease; however his symptoms are  very atypical and similar to those he presented with in June.  At that  time, the patient was admitted and ruled out for myocardial infarction  and followed up with an outpatient nuclear stress test which was  negative for ischemia.  We will cycle his biomarkers x2 additional sets.  If these remain negative, I believe the patient can be discharged home  with continue medical therapy.  Of concern, the patient does not know  his current medications and this will require further clarification.  We  will continue aspirin 81 mg once daily, metoprolol 25 mg b.i.d., and  Zocor 40 mg daily.  There was a mention of both Zocor and Lipitor in his  last clinic note but the patient is unclear which he takes.      Audery Amel, MD  Electronically Signed     SHG/MEDQ  D:  12/09/2007  T:  12/09/2007  Job:  [id number]

## 2010-06-05 NOTE — H&P (Signed)
NAME:  MOMODOU, CONSIGLIO               ACCOUNT NO.:  [id number]   MEDICAL RECORD NO.:  [id number]          PATIENT TYPE:  INP   LOCATION:  3731                         FACILITY:  [facility]   PHYSICIAN:  Rod Holler, MD     DATE OF BIRTH:  Mar 29, 1930   DATE OF ADMISSION:  06/13/2007  DATE OF DISCHARGE:                              HISTORY & PHYSICAL   PRIMARY CARE PHYSICIAN:  [place]   PRIMARY CARDIOLOGIST:  Everardo Beals. Juanda Chance, MD, [facility] [place]   HISTORY:  Mr. Mccuiston is a 77 year old male with history of coronary  disease, status post coronary bypass graft at [facility], who presented  to the emergency department with one episode of chest pain.  Tonight,  while sitting in a chair, the patient had an episode of sharp left-sided  chest pain that lasted for approximately 5 minutes.  There was no  radiation of pain.  There was no associated shortness of breath, nausea,  or diaphoresis.  The patient took one sublingual nitroglycerin after the  pain had already subsided.  At current, the patient has had no recurrent  chest pain.  He has had no complaints of palpitations, no PND or  orthopnea, no lower extremity swelling, and no syncope or presyncope.   PAST MEDICAL HISTORY:  1. Coronary disease, status post coronary bypass graft, unknown grafts      at this time.  2. Hypertension.  3. Possible ACE inhibitor associated hyperkalemia.  4. BPH.  5. Osteoarthritis.   MEDICINES:  The patient is unable to list his medicines.   ALLERGIES:  ACE INHIBITOR, hyperkalemia.   SOCIAL HISTORY:  The patient is a former smoker, is a Cytogeneticist.   FAMILY HISTORY:  Mother died of pneumonia and father with history of  coronary artery disease.   REVIEW OF SYSTEMS:  All systems were reviewed in detail and are negative  except as noted in the history of present illness.   PHYSICAL EXAMINATION:  VITAL SIGNS:  Temperature 97.6, blood pressure  139/73, heart rate 63, respiratory rate 16, and oxygen saturation 97%.  GENERAL:  Well-developed, well-nourished male, alert, oriented x3, and  in no apparent distress.  HEENT: Atraumatic and normocephalic.  Pupils equal, round, and reactive  to light.  Extraocular movements are intact.  NECK:  Supple.  No adenopathy, no JVD, and no carotid bruits.  CHEST:  Lungs clear to auscultation bilaterally with equal bilateral  breath sounds.  CORONARY:  Regular rhythm, normal rate, and normal S1 and S2.  No  murmurs, rubs or gallops.  Peripheral pulses 2+.  ABDOMEN:  Soft, nontender, and nondistended.  Active bowel sounds.  EXTREMITIES:  No clubbing, cyanosis, or edema.  NEUROLOGIC:  No focal deficits.   EKG shows normal sinus rhythm, first-degree AV block, and old inferior  infarct.  Chest x-ray shows evidence of prior coronary bypass graft.   LABORATORY DATA:  Sodium 130, potassium 4.5, chloride 108, BUN 19, and  creatinine 1.6.  CK-MB less than 1, troponin less than 0.05, and  myoglobin 93.  White blood cell count 6.4, hematocrit 37, and platelet  count 172.  IMPRESSION AND PLAN:  Mr. Apo is a 77 year old male with known  coronary disease, status post coronary bypass graft who presented with  one episode of chest pain, initial cardiac enzymes were negative, and  EKG with no acute ischemic changes.   PLAN:  1. Cardiovascular.  Admit the patient to the telemetry bed at [facility], serial cardiac enzymes, daily EKG, aspirin daily, Lipitor      daily, Lopressor b.i.d., no ACE inhibitor given his prior history      of hyperkalemia with ACE inhibitor, sublingual nitroglycerin      p.r.n., and no anticoagulation at present.  2. Obtain old records.  3. Fluids, electrolytes, and nutrition.  Cardiac diet, CMP and      magnesium level in the morning during this admission.      Rod Holler, MD  Electronically Signed     TRK/MEDQ  D:  06/13/2007  T:  06/14/2007  Job:  [id number]

## 2010-06-05 NOTE — Discharge Summary (Signed)
NAME:  Wyatt Rodriguez, Wyatt Rodriguez               ACCOUNT NO.:  [id number]   MEDICAL RECORD NO.:  [id number]          PATIENT TYPE:  INP   LOCATION:  3731                         FACILITY:  [facility]   PHYSICIAN:  Madolyn Frieze. Jens Som, MD, FACCDATE OF BIRTH:  1930/02/03   DATE OF ADMISSION:  06/13/2007  DATE OF DISCHARGE:                         DISCHARGE SUMMARY - REFERRING   PRIMARY CARDIOLOGIST:  Everardo Beals. Juanda Chance, M.D., [facility]   PRIMARY CARE PHYSICIAN:  [place].   DISCHARGE DIAGNOSES:  1. Atypical chest discomfort.  2. Hypertension.  3. History of bypass surgery, specifics unknown at this time.  History      is noted below.   [name] is a 77 year old male who while sitting in a chair on the  evening of admission developed an episode of sharp left-sided chest  discomfort that lasted for 5 minutes.  There was no radiation,  associated shortness of breath, nausea or diaphoresis.  The discomfort  had already subsided, and he took a sublingual nitroglycerin and  presented for further evaluation.   PAST MEDICAL HISTORY:  1. Bypass surgery with unknown grafts.  2. Hypertension.  3. Possible ACE inhibitor-associated hyperkalemia.  4. BPH.  5. Osteoarthritis.   As noted on admission, the patient could not list his medications, and  he is a former smoker.   LABORATORY DATA:  Admission weight was 75.9 kg.  Admission H&H was 13.1  and 37.4, MCV was 101.5, platelets 172, WBCs 6.4.  At the time of  discharge, sodium was 143, potassium 35, BUN 14, creatinine 1.36.  Normal LFTs.  CK-MBs, relative indexes, and troponins were within normal  limits x2.   Chest x-ray on admission showed CABG without acute findings.  EKG showed  sinus bradycardia, left axis deviation, incomplete bundle branch block.   HOSPITAL COURSE:  The patient was admitted to the unit 3700.  Overnight,  he did not have any chest discomfort.  Enzymes and EKGs were  unremarkable for ischemic event.  Dr. Jens Som after review felt  that  the patient could be discharged home with outpatient evaluation.   DISPOSITION:  The patient is discharged home.  Activity is not  restricted.  Wound care not applicable.  He was asked to continue his  home medications which are believed to include Lopressor, aspirin,  nitroglycerin 0.4 mg p.r.n., and Lipitor daily.  Doses are not available  at this time.  He was asked to bring all of his medications to all  appointments for clarification.  Dr. Jens Som would like him to have an  adenosine Myoview and then a followup appointment with him in the next 2-  3 weeks, and the office will call the patient with this information.   TIME SPENT AT DISCHARGE:  25 minutes.      Joellyn Rued, PA-C      Madolyn Frieze Jens Som, MD, [facility]  Electronically Signed    EW/MEDQ  D:  06/14/2007  T:  06/14/2007  Job:  [id number]   cc:   Everardo Beals. Juanda Chance, MD, [facility]  [place]

## 2010-06-05 NOTE — Assessment & Plan Note (Signed)
 HEALTHCARE                            CARDIOLOGY OFFICE NOTE   NAME:Magda, ALECSANDER HATTABAUGH                      MRN:          [id number]  DATE:07/06/2007                            DOB:          [birth year]    Mr. Addo is a 77 year old gentleman who was recently admitted to [facility] with chest pain.  Note, he does have a history of coronary  artery bypass and graft in September 1997.  At that time, he had LIMA to  the LAD, sequential saphenous vein graft to the OM1 and OM2, and  saphenous vein graft to the PDA.  He was admitted with atypical chest  pain and ruled out for myocardial infarction with serial enzymes.  He  ultimately underwent a Myoview on June 25, 2007, that showed an ejection  fraction of 41%.  There was scar in the inferior and inferolateral walls  with soft tissue attenuation, but no significant ischemia.  Since  discharge, he denies any chest pain, dyspnea, or syncope.  He denies any  pedal edema.  He has had fatigue.   MEDICATIONS:  1. Aspirin 81 mg p.o. daily.  2. Zocor 80 mg p.o. daily.  3. Lopressor 25 mg p.o. daily.  4. There is a mention of Lipitor.   PHYSICAL EXAMINATION:  Today shows blood pressure of 138/76 and his  pulse is 55.  He weighs 168 pounds.  HEENT:  Normal.  NECK:  Supple.  CHEST:  Clear.  CARDIOVASCULAR:  Regular rate and rhythm.  ABDOMEN:  Mid abdominal bruit and also a pulsatile mass.  EXTREMITIES:  No edema.   DIAGNOSES:  1. Coronary artery disease, status post coronary artery bypass and      graft - the patient has had no further chest pain.  His Myoview      shows no ischemia.  We will continue with medical therapy to      include his aspirin, statin, and beta-blocker.  We will make sure      that he is not on two statins.  2. Bruit and pulsatile mass on examination in the abdomen - we will      schedule him to have an ultrasound.  3. History of cerebrovascular disease at the time of previous  bypass -      he needs followup carotid Dopplers.  4. Fatigue - we will check a TSH.  5. History of renal insufficiency and hyperkalemia on ACE inhibitors.  6. Hypertension - his blood pressure  is adequately controlled on his      present medications.  7. Hyperlipidemia - he will continue on his statin as described above.      This is being followed at the [facility].  8. Benign prostatic hypertrophy.  9. Osteoarthritis.   I will see him back in 6 months.      Madolyn Frieze Jens Som, MD, [facility]  Electronically Signed    BSC/MedQ  DD: 07/06/2007  DT: 07/06/2007  Job #: [id number]

## 2010-06-05 NOTE — Consult Note (Signed)
NAMETHALES, KNIPPLE               ACCOUNT NO.:  [id number]   MEDICAL RECORD NO.:  [id number]          PATIENT TYPE:  INP   LOCATION:  6714                         FACILITY:  [facility]   PHYSICIAN:  Larina Earthly, M.D.    DATE OF BIRTH:  1930-02-21   DATE OF CONSULTATION:  05/31/2008  DATE OF DISCHARGE:                                 CONSULTATION   REASON FOR CONSULTATION:  Incidental finding of infrarenal abdominal  aortic aneurysm.   HISTORY OF PRESENT ILLNESS:  Wyatt Rodriguez is a 77 year old gentleman  who was admitted to the hospital on May 24, 2008, with diarrhea, nausea,  vomiting, and anorexia.  He was found to have creatinine of 4.29 on  admission.  He underwent further evaluation to include a noncontrast CT,  which showed an incidental finding of a 5 cm infrarenal abdominal aortic  aneurysm, I am seeing him further discussion of this.  He had no former  knowledge of his aneurysm and has no symptoms related to this.   PAST MEDICAL HISTORY:  Significant for:  1. Coronary artery disease with coronary artery bypass grafting in      1997.  2. Does have a history of prior myocardial infarctions.  3. Does has have history of hypertension.  4. Benign prostatic hypertrophy.  5. Osteoarthritis.  6. Dementia.   MEDICATIONS ON ADMISSION:  Lisinopril, metoprolol, galantamine, Trental,  simvastatin, and hydrochlorothiazide.   ALLERGIES:  None.   SOCIAL HISTORY:  He is divorced, lives in [place].  He has 4  children.  Quit smoking 40 years ago.  He does not drink alcohol.   FAMILY HISTORY:  His father died of cardiac.  There is no known history  of aneurysms.   REVIEW OF SYSTEMS:  Otherwise unremarkable.   PHYSICAL EXAMINATION:  Well-developed white male appreciated stated age  of 37, in no acute distress.  His radial, femoral, popliteal, and  posterior tibial pulses are 2+ with no evidence of peripheral aneurysm.  His abdominal exam reveals easily palpable aneurysm, which is  nontender.   IMPRESSION:  Infrarenal abdominal aortic aneurysm.   PLAN:  The patient will be seen again in 6 months in my office with  ultrasound to rule out enlargement I discussed the significance of his  aneurysm with Wyatt Rodriguez and also explained symptoms.  He knows to call  EMS and report immediately to the emergency room should he have any  abdominal pain.  I did discuss elective repair should he have continued  enlargement.      Larina Earthly, M.D.  Electronically Signed     TFE/MEDQ  D:  05/31/2008  T:  06/01/2008  Job:  [id number]

## 2010-06-05 NOTE — H&P (Signed)
NAME:  Wyatt Rodriguez, FACE NO.:  [id number]   MEDICAL RECORD NO.:  [id number]          PATIENT TYPE:  EMS   LOCATION:  MAJO                         FACILITY:  [facility]   PHYSICIAN:  Elliot Cousin, M.D.    DATE OF BIRTH:  Apr 19, 1930   DATE OF ADMISSION:  05/24/2008  DATE OF DISCHARGE:                              HISTORY & PHYSICAL   PRIMARY CARE PHYSICIAN:  Dr. Flonnie Hailstone at the [facility] in  [place], [place].   PRIMARY CARDIOLOGIST:  Madolyn Frieze. Jens Som, MD, [facility]   CHIEF COMPLAINT:  Diarrhea and poor appetite (the patient recently ate a  hamburger at a local convenience store).   HISTORY OF PRESENT ILLNESS:  The patient is a 77 year old man with a  past medical history significant for coronary artery disease,  hypertension, and osteoarthritis.  He presents to the emergency  department with a chief complaint of diarrhea, poor appetite, and  generalized weakness.  He says that his symptoms started approximately 5  or 6 days ago.  The diarrhea began approximately 1-2 days after eating a  hamburger at a local convenience store.  His diarrhea has been watery in  consistency.  It has been brown in color.  He denies abdominal cramping,  black tarry stools and bright red blood per rectum.  He has had no  associated nausea and vomiting; however, his appetite and oral intake  have both been poor.  In fact, he has eaten very little over the past 3-  4 days.  He also acknowledges that he has not been drinking much fluid.  He denies painful urination, fever, and chills.  He denies recent  travel.  He denies treatment with antibiotics recently.   During the evaluation in the emergency department, the patient is noted  to be afebrile and mildly bradycardic.  His blood pressure is on the low-  normal side ranging from 94 to 110 systolically.  His lab data are  significant for a serum potassium of 5.8, BUN of 58, and creatinine of  4.29.  An abdominal and pelvic CT  was ordered in the emergency  department and it revealed 2 low density abnormalities in the liver  likely representing benign cysts or hemangiomas.  The CT scan also  revealed an infrarenal abdominal aortic aneurysm with a maximum  transverse diameter of 5 cm.  There were no acute findings in the pelvis  although there was evidence of an enlarged prostate.  The patient is  being admitted for further evaluation and management.   PAST MEDICAL HISTORY:  1. History of acute renal failure and dehydration in February 2007,      complicated by hyperkalemia in the setting of ACE inhibitor      therapy.  2. Coronary artery disease status post CABG in 1997.  The patient also      has a history of myocardial infarctions.  3. Hypertension.  4. BPH.  5. Osteoarthritis.  6. Dementia.  7. Probable peripheral vascular disease.   MEDICATIONS:  1. Lisinopril 40 mg daily.  2. Metoprolol 50 mg half a tablet b.i.d.  3. Galantamine  4 mg q.h.s.  4. Pentoxifylline 400 mg t.i.d.  5. Simvastatin 80 mg q.h.s.  6. Hydrochlorothiazide 25 mg half a tablet daily.   ALLERGIES:  No known drug allergies.   SOCIAL HISTORY:  The patient is divorced.  [place].  He has 4 children.  Apparently one of his daughters,  Misty Stanley, was present earlier although she is not available now.  Her phone  number is [phone].  The patient is retired from [employer].  He  stopped smoking more than 40 years ago.  He stopped drinking alcohol  approximately 40-45 years ago.   FAMILY HISTORY:  His father died of heart trouble.  The etiology of  the death of his mother is unknown.   REVIEW OF SYSTEMS:  Otherwise negative.   PHYSICAL EXAMINATION:  VITAL SIGNS:  Temperature 97.9, blood pressure  110/54, pulse 58, respiratory rate 12, oxygen saturation 100% on room  air.  GENERAL:  The patient is a pleasant, alert 77 year old Caucasian man who  is currently sitting up in bed in no acute  distress.  HEENT: Head is normocephalic nontraumatic.  Pupils equal, round,  reactive to light.  Extraocular movements are intact.  Conjunctivae are  clear.  Sclerae are white.  Tympanic membranes are mildly scarred  bilaterally but with no acute findings.  Nasal mucosa is dry with no  sinus tenderness.  Oropharynx reveals poor dentition.  Mucous membranes  are  mildly dry.  NECK:  Supple.  No adenopathy, no thyromegaly, no bruit, no JVD.  LUNGS:  Clear to auscultation bilaterally.  HEART:  S1, S2 with no murmurs, rubs or gallops.  CHEST WALL:  There is a well-healed sternotomy scar which is nontender.  ABDOMEN:  Mildly obese, positive bowel sounds, soft, nontender,  nondistended.  No hepatosplenomegaly.  No masses palpated.  No audible  bruits.  GU:  Deferred.  RECTAL:  Deferred.  However, the patient had just had a bowel movement  and the stool is watery and brown in color.  EXTREMITIES:  No pedal edema.  Some varicosities of both lower legs.  NEUROLOGIC:  The patient is alert and oriented x2.  Cranial nerves II-  XII are intact.  Strength is 5/5 throughout.  Sensation is intact.   ADMISSION LABORATORIES:  EKG reveals sinus bradycardia with a first-  degree AV block and a heart rate of 53 beats per minute.  Chest x-ray  reveals no acute chest disease.  CT scan of the abdomen and pelvis  results are above.  CK-MB less than 1, troponin I less than 0.05,  myoglobin 177.  Sodium 140, potassium 5.8, chloride 115, CO2 18, glucose  99, BUN 58, creatinine 4.29, total bilirubin 0.7, alkaline phosphatase  92, SGOT 15, 13, total protein 6.8, albumin 3.7, calcium 9.6.  WBC 8.8,  hemoglobin 11.6, hematocrit 33.4, MCV 100.4, platelets 168.   ASSESSMENT:  1. Acute diarrheal illness.  Given the patient's recent history of a      hamburger eaten and prepared at a local convenience store,      Escherichia coli gastroenteritis is a concern.  2. Acute renal failure.  The patient's BUN is 58 and his  creatinine is      4.29.  In comparison, on February 06, 2008, his BUN was 18 and his      creatinine was 1.45.  The patient's acute renal failure is likely      secondary to prerenal azotemia as a consequence of volume depletion  from diarrhea and a poor oral intake.  This is also in the setting      of angiotensin-converting enzyme therapy with lisinopril and      diuretic therapy with hydrochlorothiazide.  3. Metabolic acidosis.  The patient's bicarbonate is 18.  The acidosis      is likely secondary to not only diarrhea but also to the acute      renal failure.  4. Hyperkalemia.  The patient's serum potassium is 5.3.  The      hyperkalemia is secondary to acute renal failure concomitant with      angiotensin-converting enzyme inhibitor therapy with lisinopril.  5. Macrocytic anemia.  The patient's hemoglobin is 11.6.  In      comparison, his hemoglobin was 14.4 on February 06, 2008.  He does      have a history of macrocytosis per past lab data.  6. Bradycardia with a first degree atrioventricular block.  The      patient's bradycardia is probably a consequence of metoprolol and      hyperkalemia.  7. Probable benign liver cysts per computerized tomography scan of the      abdomen.  8. 5 cm infrarenal abdominal aortic aneurysm per computerized      tomography scan of the abdomen and pelvis.  The patient was not      aware of an aneurysm.  Apparently this is a new finding.  9. Coronary artery disease.  The patient's initial cardiac markers      were unremarkable.   PLAN:  1. The patient was given IV fluids, calcium gluconate, and albuterol      in the emergency department.  Apparently he was also given      Kayexalate suspension.  2. We will treat his hyperkalemia and metabolic acidosis with 0.5 amp      of bicarb now followed by IV fluids with half-normal saline with      bicarbonate added.  3. We will monitor his serum potassium and renal function closely.  4. We will check  stool studies and start Cipro and Flagyl empirically.  5. Will discontinue the lisinopril and hydrochlorothiazide.  We will      decrease the dose of metoprolol in the setting of relative      hypotension and bradycardia.  6. Will check a vitamin B12 and folate level to assess for      macrocytosis.  We will also check a TSH, lipase, and 2 sets of      cardiac enzymes for further evaluation.  7. We will consider a vascular consultation in the next day or two      when the patient is more clinically stable for evaluation of the      abdominal aortic aneurysm.      Elliot Cousin, M.D.  Electronically Signed     DF/MEDQ  D:  05/24/2008  T:  05/24/2008  Job:  [id number]   cc:   Madolyn Frieze. Jens Som, MD, [facility]

## 2010-06-05 NOTE — Group Therapy Note (Signed)
NAMEAGOSTINO, GORIN               ACCOUNT NO.:  [id number]   MEDICAL RECORD NO.:  [id number]          PATIENT TYPE:  INP   LOCATION:  6714                         FACILITY:  [facility]   PHYSICIAN:  Herbie Saxon, MDDATE OF BIRTH:  [birth year]                                 PROGRESS NOTE   DISCHARGE SUMMARY/PROGRESS NOTE:   DISCHARGE DIAGNOSES:  1. Acute on chronic renal failure.  2. Left lower lobe pneumonia.  3. Hypertension.  4. Dementia.  5. Dysthyroidism.  6. Infrarenal abdominal aortic aneurysm.  7. Macrocytic anemia.  8. Benign prostatic hypertrophy.  9. Metabolic acidosis.  10.Osteoarthritis.  11.History of coronary artery disease, status post coronary artery      bypass graft surgery.  12.Peripheral vascular disease.  13.Bradycardia with old hypokalemia, resolved.   CONSULTATIONS:  Dr. Maree Krabbe, nephrology.   RADIOLOGY:  1. Chest x-ray of 05/25/2008, reveals increasing left lower lobe air      space disease which may be atelectasis or pneumonia.  2. A CT of the abdomen and pelvis of 05/24/2008, reveals an enlarged      prostate.  There is an infrarenal abdominal aortic aneurysm with      maximum diameter of 5 cm.  No sign of bleeding.  Two benign cysts,      a hemangioma in the liver.  Finding of a non-obstructive calculus      on each side.  The kidneys are small but no mass, cysts or      hydronephrosis.  The pancreas is unremarkable.  3. Chest x-ray of 05/24/2008, showed no acute disease.   HOSPITAL COURSE:  This 77 year old Caucasian gentleman presented with diarrhea and a poor  appetite, status post eating a hamburger at a local convenience store.  His symptoms lasted for one or two days after the ingestion of the  hamburger.  He had associated nausea, vomiting and anorexia.  The  patient denies any dysuria, fever or chills.   On presentation his potassium was elevated up to 5.8, BUN 58, creatinine  4.29.  The patient was admitted and started  on IV fluid hydration.  He  was noticed to be having bradycardia, and his beta blocker was  temporarily held.  The patient was noted to be having a slight cough on  05/25/2008, and an x-ray was suggestive of a left lower lobe pneumonia,  versus atelectasis.  He was started on IV Avelox and his initial Cipro  that he was started on was held.  The thyroid function tests showed  dysthyroidism with a low TSH.  There were no obvious signs of  hyperthyroidism.  The patient had severe hypokalemia on 05/26/2008,  which improved with a bolus of IV fluid with normal saline.   The patient's renal failure and metabolic acidosis was noted to be  worsening with hyponatremia as of 05/28/2008.  A renal consultation was  sought with Dr. Arlean Hopping .  The nephrologist is suggesting having a  urologist's evaluation in the morning.  The kidney function is improving  slowly.   PHYSICAL EXAMINATION:  GENERAL:  Today, he is an elderly man, not  in acute distress.  He is  demented.  VITAL SIGNS:  Temperature 98 degrees, pulse 80, respirations 20, blood  pressure 93/64.  HEENT:  Pale but not jaundiced.  Head is normocephalic and atraumatic.  Mucous membranes moist.  NECK:  Supple.  ABDOMEN:  Benign.  NEUROLOGIC:  He is alert.  EXTREMITIES:  Peripheral pulses present but reduced.  He has 1+ edema.   LABORATORY DATA:  Glucose 125, BUN 48, creatinine 4.3, bicarbonate 16, albumin level 2.7.   PLAN    Hold beta blocker if his blood pressure is over 100/60.    Review the patient's labs in the morning.      Herbie Saxon, MD  Electronically Signed     MIO/MEDQ  D:  05/29/2008  T:  05/29/2008  Job:  [id number]

## 2010-06-05 NOTE — Discharge Summary (Signed)
Wyatt Rodriguez, Wyatt Rodriguez               ACCOUNT NO.:  [id number]   MEDICAL RECORD NO.:  [id number]          PATIENT TYPE:  INP   LOCATION:  6730                         FACILITY:  [facility]   PHYSICIAN:  Elliot Cousin, M.D.    DATE OF BIRTH:  Nov 25, 1930   DATE OF ADMISSION:  05/24/2008  DATE OF DISCHARGE:  06/03/2008                               DISCHARGE SUMMARY   ADDENDUM:   Please see the previous discharge summary, dictated by Dr. Christella Noa.  This is an addendum.   CONSULTATIONS:  1. Gretta Began, M.D.  2. Dr. Su Grand.   PROCEDURE PERFORMED:  Renal ultrasound on May 30, 2008.  The results  revealed normal size and appearance of both kidneys.  No evidence of  renal mass or hydronephrosis.   HOSPITAL COURSE/DISCHARGE DIAGNOSES:  ACUTE RENAL FAILURE WITH A HISTORY  OF STAGE II CHRONIC KIDNEY DISEASE.  The patient's renal function  continued to improve following the Foley catheter insertion.  The  metabolic acidosis that was secondary to the acute renal failure  resolved.  As of today, the patient's BUN is 6 and his creatinine is  1.35.  The acute renal failure was thought to be secondary to ACE  inhibitor therapy and the diarrheal illness.  Nephrologist Dr. Arlean Hopping  recommended no further therapy with ACE inhibitors.    OBSTRUCTIVE UROPATHY WITH A HISTORY OF BENIGN PROSTATIC HYPERTROPHY.  Urologist, Dr. Brunilda Payor, was consulted.  A renal ultrasound was ordered and  revealed no evidence of hydronephrosis.  Dr. Madilyn Hook assistant, Ms.  Adams, evaluated the patient and recommended that the patient be  discharged with the indwelling Foley catheter in place.  She also  recommended starting Flomax at 0.4 mg daily, which was done.  The  patient was advised to follow up with Dr. Brunilda Payor on Jun 09, 2008 at 9:15  a.m.    PNEUMONIA.  The patient is currently afebrile and has no complaints of  shortness of breath.  He completed a full 7 day course of Avelox  therapy.    DIARRHEAL ILLNESS.  The patient  has completed 9 out of 10 days of  therapy with Flagyl.  The stool specimens collected, however, did not  reveal evidence of C. difficile or any other enteric bacterial  infection.  The diarrhea has completely resolved.    INCIDENTAL FINDING OF 5-CM INFRARENAL ABDOMINAL AORTIC ANEURYSM.  Vascular surgeon, Dr. Arbie Cookey, was consulted for evaluation.  Dr. Arbie Cookey  recommended continued observation and monitoring.  He recommended that  the patient follow up with him in his office in 6 months.  Dr. Bosie Helper  phone number is [phone].    CONFUSION WITH DEMENTIA.  The patient is currently stable and  oriented.  He will remain on Reminyl.  A couple of times during the  hospitalization, he required as-needed Haldol.    HYPOKALEMIA.  The patient's serum potassium fell to 3.0 yesterday.  He  was repleted with potassium chloride orally and today his serum  potassium is 3.6.    HISTORY OF HYPERTENSION WITH LOW NORMAL BLOOD PRESSURES.  The  patient's blood pressures have been ranging  in the upper 90s  systolically.  He is being maintained on metoprolol 12.5 mg b.i.d.    DECONDITIONING.  Both the physical and occupational therapists  evaluated the patient and recommended further rehabilitation in a  skilled environment.  The patient's daughter was in complete agreement.  The patient did agree, as well.   Of note, the nephrology team ordered protein and urine electrophoresis.  The studies revealed no Bence Jones protein and no M spike.   DISCHARGE DISPOSITION:  The patient is currently stable and in improved  condition.  The plan is to discharge him to [facility]  nursing facility today.   DISCHARGE MEDICATIONS:  1. Potassium chloride 10 mEq daily.  2. Aspirin 81 mg daily.  3. Reminyl 4 mg q.h.s.  4. Lopressor 12.5 mg b.i.d. (do not give if his systolic blood      pressure is less than 100).  5. Flagyl 500 mg t.i.d. for one more day.  6. Trental 400 mg daily.  7. Zocor 80 mg  q.h.s.  8. Flomax 0.4 mg daily.  9. Multivitamin once daily.      Elliot Cousin, M.D.  Electronically Signed     DF/MEDQ  D:  06/03/2008  T:  06/03/2008  Job:  [id number]   cc:   Madolyn Frieze. Jens Som, MD, [facility]  Larina Earthly, M.D.  Maree Krabbe, M.D.  Lindaann Slough, M.D.

## 2010-06-08 NOTE — H&P (Signed)
NAME:  Wyatt Rodriguez, Wyatt Rodriguez NO.:  [id number]   MEDICAL RECORD NO.:  [id number]          PATIENT TYPE:  EMS   LOCATION:  MAJO                         FACILITY:  [facility]   PHYSICIAN:  Michaelyn Barter, M.D. DATE OF BIRTH:  06-28-1930   DATE OF ADMISSION:  03/13/2005  DATE OF DISCHARGE:                                HISTORY & PHYSICAL   PRIMARY CARE PHYSICIAN:  Unassigned.  He attends the [employer] in [place].   CARDIOLOGIST:  He used to see Dr. Juanda Chance, cardiologist.   CHIEF COMPLAINT:  The patient felt sick.   HISTORY OF PRESENT ILLNESS:  Mr. Andujo is a 77 year old gentleman who  states that at approximately 4 p.m. today he took a friend to see the  doctor.  After dropping the friend off, he felt sick.  His complaints of  sickness are very vague.  He states that he had the urge to vomit; however,  he did not.  At his friend's urging, he decided to come to the ER for  further evaluation.  He denies having any chest pain.  No abdominal pain,  although states his stomach did feel upset.  No fevers, no chills, no  difficulty breathing.  He states that approximately 2 weeks ago he did  develop diarrhea; however, this resolved, only to return approximately a  week later.  He has no additional complaints at this particular time.   PAST MEDICAL HISTORY:  1.  MI x10 according to the patient.  The last time the patient had an MI      was approximately in 1996.  2.  Arthritis.  3.  Hypertension.  4.  BPH.   PAST SURGICAL HISTORY:  Coronary artery disease.   ALLERGIES:  No known drug allergies.   HOME MEDICATIONS:  1.  Aspirin.  2.  Zestril 20 mg p.o. daily.  3.  Metoprolol 25 mg twice daily.  4.  Zocor 80 mg daily.   SOCIAL HISTORY:  Cigarettes:  The patient stopped smoking several years,  started smoking while in his age of 77s.  The patient was a heavy smoker,  but stopped in 1961.  Alcohol:  The patient does not currently drink, but in  the past he used to drink beer  and occasionally vodka.   FAMILY HISTORY:  Mother's medical history is unknown.  He believes his  mother may have died from pneumonia.  Father had a history of heart  problems.   REVIEW OF SYSTEMS:  Review of systems as per HPI; otherwise, all other  systems are negative.   PHYSICAL EXAMINATION:  VITALS:  Temperature is 96.7, blood pressure 113/63,  heart rate 61, respirations 24, O2 SAT 99% on room air.  HEENT:  Anicteric.  Extraocular movements are intact.  Pupils are equally  reactive to light.  Oral mucosa is pink, moist, no thrush, no exudates.  NECK:  Supple and no lymphadenopathy.  Thyroid is not palpable.  CARDIAC:  S1 and S2 are present.  Regular rate and rhythm.  No S3 and S4.  No parasternal heave.  PMI is not palpated.  LUNGS:  Clear.  ABDOMEN:  Soft, nontender and non-distended.  Positive bowel sounds.  No  masses.  EXTREMITIES:  No edema.  NEUROLOGIC:  The patient is alert and oriented x3.  Cranial nerves II-XII  are intact.  MUSCULOSKELETAL:  5/5.   LABORATORY DATA:  An ABG was completed and his pH was found to be 7.340,  PCO2 was 33.6, bicarbonate 18.1.  He had a CBC completed; the white blood  cell count was 8.6, hemoglobin 11.6, hematocrit 33.3 and platelets were  218,000.  Sodium was 136.  The potassium was 6.3; repeat potassium was 6.6.  Chloride was 112, glucose 108, BUN is 25, creatinine 2.2.   ACCESSORY CLINICAL DATA:  EKG:  The EKG was completed and reveals first-  degree atrioventricular block with Q waves in leads III.  In addition, there  are tall or peaked T waves in leads V2, V3 and V4.  The acuity of these  changes is questionable.   ASSESSMENT AND PLAN:  Mr. Grewe is a 77 year old gentleman who arrived at  the hospital with very vague symptoms, stating that he felt sick.   1.  Hyperkalemia.  This could have been associated with a combination of      factors including the ACE inhibitors that patient has been on in      addition to the diarrhea  that the patient has been experiencing.  The      patient has received treatment in the emergency department consisting of      D-50 one ampule, followed by 10 units of Regular insulin in addition to      Kayexalate 30 g p.o. x1, and calcium chloride.  Repeat laboratories have      not been checked since these treatments have been provided; therefore, I      will have to repeat the patient's laboratories and consider additional      therapy if needed.  2.  Questionable findings on EKG.  The patient had an old EKG completed from      April 05, 2003.  It has some similar abnormalities present; however,      they are not identical.  Therefore, given the patient's extensive      history of coronary artery disease, status post coronary artery bypass      graft, we will check his cardiac enzymes x3 q.8 h. apart including      troponin I and CK-MB.  3.  Diarrhea.  We will check stool studies for C. difficile, Shigella and      Salmonella.  4.  History of hypertension.  This currently is stable.  We will monitor for      now.  5.  History of arthritis.  The patient currently has no complaints.  6.  Gastrointestinal prophylaxis.  We will provide Protonix.  7.  Deep venous thrombosis prophylaxis.  We will provide Lovenox 40 mg      subcutaneously daily.      Michaelyn Barter, M.D.  Electronically Signed     OR/MEDQ  D:  03/13/2005  T:  03/13/2005  Job:  [id number]

## 2010-06-08 NOTE — Discharge Summary (Signed)
Wyatt Rodriguez, Wyatt Rodriguez               ACCOUNT NO.:  [id number]   MEDICAL RECORD NO.:  [id number]          PATIENT TYPE:  INP   LOCATION:  4731                         FACILITY:  [facility]   PHYSICIAN:  Isidor Holts, M.D.  DATE OF BIRTH:  [birth year]   DATE OF ADMISSION:  03/12/2005  DATE OF DISCHARGE:  03/14/2005                                 DISCHARGE SUMMARY   PRIMARY CARE Hershall Benkert:  [facility].   PRIMARY CARDIOLOGIST:  Charlies Constable, M.D.   DISCHARGE DIAGNOSES:  1.  Acute renal failure/Dehydration.  2.  Hyperkalemia, secondary to #1 above/angiotensin-converting enzyme      inhibitor treatment.  3.  Dry cough, secondary to angiotensin-converting enzyme inhibitor.  4.  Ileus secondary to hyperkalemia.  5.  Hypertension.  6.  History of coronary artery disease, status post coronary artery bypass      graft.  7.  Dyslipidemia.  8.  History of benign prostatic hypertrophy.  9.  Osteoarthritis.   DISCHARGE MEDICATIONS:  1.  Aspirin 81 mg p.o. daily (over-the-counter).  2.  Metoprolol 25 mg p.o. daily.  3.  Zocor 80 mg p.o. q.h.s.  4.  Zestril.  This has been discontinued.   PROCEDURES:  1.  Two-view chest x-ray dated March 12, 2005:  This showed prior CABG,      stable heart size, left bibasilar atelectasis, small calcified right      upper lobe granuloma, also ileus with partial small bowel obstruction.  2.  Abdominal x-ray dated March 14, 2005:  This showed normal bowel gas      pattern.   CONSULTATIONS:  None.   ADMISSION HISTORY:  As in H&P notes of March 13, 2005, dictated by  Michaelyn Barter, M.D.  However, in brief, this is a 77 year old male with  known history of coronary artery disease, status post CABG, also status post  previous MIs, osteoarthritis, hypertension, BPH and dyslipidemia, who  presents feeling nonspecifically unwell.  He had had a diarrheal illness  approximately two weeks ago, but this has resolved.  Certainly, no GI  symptoms  in the past one week.  Denies fever or chills, chest pain or  shortness of breath.  Initial evaluation in the emergency department  demonstrated a BUN of 35 with a creatinine of 2.2 and a potassium of 6.6.  The patient was admitted for further evaluation, investigation and  management.   CLINICAL COURSE:  Problem 1.  ACUTE RENAL FAILURE WITH HYPERKALEMIA:  The patient is on ACE  inhibitor treatment and apparently had a diarrheal illness approximately two  weeks ago, which resolved only about a week ago.  Oral intake was  questionable.  It appears that the patient had become dehydrated, with  resultant acute renal failure and hyperkalemia.  It is possible that ACE  inhibitor treatment may have had a superadded effect.  ACE inhibitor was  therefore held.  The patient's hyperkalemia was urgently managed with  intravenous calcium, D50W, and insulin and also p.o. Kayexalate.  By  March 13, 2005, potassium was much improved at 5.4 and by March 14, 2005, had normalized at  4.0.  Dehydration/acute renal failure was managed  with intravenous infusion of normal saline, with improvement in hydration  status, and BUN by March 14, 2005, had dropped to 13 with a creatinine of  1.5.  The patient's baseline creatinine is not known, but it is suspected  this is at, or close to baseline.   Problem 2.  DRY COUGH:  This is likely secondary to ACE inhibitor treatment.  The patient suffered with this for the past one week.  We have discontinued  ACE inhibitor and expect complete resolution.   Problem 3.  ILEUS:  Initial chest x-ray, also incidentally demonstrated an  ileus, which is deemed likely secondary to hyperkalemia.  Repeat abdominal x-  ray of March 14, 2005, showed completely normal bowel gas pattern.  The  patient has been able to maintain adequate overall intake and move his  bowels.   Problem 4.  HYPERTENSION:  This was controlled during the course of the  patient's hospital stay,  with his preadmission dosages of metoprolol.   Problem 5.  CORONARY ARTERY DISEASE:  The patient remained completely  asymptomatic from this viewpoint.  Certainly no clinical evidence of heart  failure.  Cardiac enzymes were cycled and remained unelevated.  EKG showed  no acute ischemic changes.   Problem 6.  OSTEOARTHRITIS:  There were no problems referable to this during  the course of patient's hospital stay.   Problem 7.  BENIGN PROSTATIC HYPERTROPHY:  Certainly there were no problems  referable to this during the course of patient's hospital stay.   DISPOSITION:  The patient was considered sufficiently recovered to be  discharged in stable condition, on March 14, 2005.   DIET:  A healthy heart diet.   ACTIVITY:  As tolerated.   WOUND CARE:  Not applicable.   PAIN MANAGEMENT:  Not applicable.   FOLLOWUP INSTRUCTIONS:  The patient is recommended to follow up with his  primary cardiologist, Dr. Charlies Constable, in one to two weeks.  He has been  requested to call for an appointment and he has verbalized understanding.  He is also to follow up with his primary M.D. at [facility]  routinely.  He has also been instructed to call for an appointment.   SPECIAL INSTRUCTIONS:  The patient has been recommended to discontinue/avoid  ACE inhibitors until reviewed by his primary M.D.      Isidor Holts, M.D.  Electronically Signed     CO/MEDQ  D:  03/14/2005  T:  03/14/2005  Job:  [id number]   cc:   Charlies Constable, M.D. [facility]  [address]  [address]  [place]  [place] [address]

## 2010-10-17 LAB — CBC
HCT: 37.4 — ABNORMAL LOW
MCHC: 35
MCV: 101.5 — ABNORMAL HIGH
Platelets: 172
WBC: 6.4

## 2010-10-17 LAB — COMPREHENSIVE METABOLIC PANEL
ALT: 16
BUN: 14
CO2: 28
Calcium: 9.6
Creatinine, Ser: 1.36
GFR calc non Af Amer: 51 — ABNORMAL LOW
Glucose, Bld: 84
Total Protein: 6.3

## 2010-10-17 LAB — DIFFERENTIAL
Basophils Relative: 1
Eosinophils Absolute: 0.2
Eosinophils Relative: 3
Lymphs Abs: 1.8
Monocytes Relative: 6
Neutrophils Relative %: 62

## 2010-10-17 LAB — POCT I-STAT, CHEM 8
Creatinine, Ser: 1.6 — ABNORMAL HIGH
Glucose, Bld: 130 — ABNORMAL HIGH
HCT: 39
Hemoglobin: 13.3
Potassium: 4.5
Sodium: 141
TCO2: 22

## 2010-10-17 LAB — CARDIAC PANEL(CRET KIN+CKTOT+MB+TROPI)
CK, MB: 1.3
Relative Index: INVALID
Relative Index: INVALID
Total CK: 85
Total CK: 90
Troponin I: 0.02

## 2010-10-17 LAB — MAGNESIUM: Magnesium: 2.1

## 2010-10-23 LAB — POCT CARDIAC MARKERS
CKMB, poc: <1 ng/mL — ABNORMAL LOW (ref 1.0–8.0)
Myoglobin, poc: 63 ng/mL (ref 12–200)
Myoglobin, poc: 95.4 ng/mL (ref 12–200)
Troponin i, poc: <0.05 ng/mL (ref 0.00–0.09)

## 2010-10-23 LAB — DIFFERENTIAL
Eosinophils Relative: 5 % (ref 0–5)
Lymphocytes Relative: 38 % (ref 12–46)
Lymphs Abs: 2.6 10*3/uL (ref 0.7–4.0)
Monocytes Relative: 7 % (ref 3–12)

## 2010-10-23 LAB — CBC
HCT: 40.9 % (ref 39.0–52.0)
Platelets: 185 10*3/uL (ref 150–400)
RBC: 3.91 MIL/uL — ABNORMAL LOW (ref 4.22–5.81)
WBC: 6.8 10*3/uL (ref 4.0–10.5)

## 2010-10-23 LAB — CK TOTAL AND CKMB (NOT AT ARMC)
CK, MB: 1.2 ng/mL (ref 0.3–4.0)
Relative Index: INVALID (ref 0.0–2.5)
Total CK: 75 U/L (ref 7–232)
Total CK: 83 U/L (ref 7–232)

## 2010-10-23 LAB — BASIC METABOLIC PANEL
BUN: 14 mg/dL (ref 6–23)
Chloride: 107 mEq/L (ref 96–112)
GFR calc Af Amer: >60 mL/min (ref >60)
GFR calc non Af Amer: 54 mL/min — ABNORMAL LOW (ref >60)
Potassium: 3.2 mEq/L — ABNORMAL LOW (ref 3.5–5.1)
Sodium: 140 mEq/L (ref 135–145)

## 2010-10-23 LAB — PROTIME-INR
INR: 1 (ref 0.00–1.49)
Prothrombin Time: 13.8 seconds (ref 11.6–15.2)

## 2011-02-22 ENCOUNTER — Other Ambulatory Visit (HOSPITAL_COMMUNITY): Payer: Self-pay | Admitting: Geriatric Medicine

## 2011-02-28 ENCOUNTER — Ambulatory Visit (HOSPITAL_COMMUNITY)
Admission: RE | Admit: 2011-02-28 | Discharge: 2011-02-28 | Disposition: A | Discharge status: Home / Self Care | Payer: Medicare Other | Source: Ambulatory Visit | Attending: Geriatric Medicine | Admitting: Geriatric Medicine

## 2011-02-28 ENCOUNTER — Other Ambulatory Visit (HOSPITAL_COMMUNITY): Payer: Medicare Other

## 2011-02-28 ENCOUNTER — Ambulatory Visit (HOSPITAL_COMMUNITY)
Admission: RE | Admit: 2011-02-28 | Discharge: 2011-02-28 | Disposition: A | Discharge status: Home / Self Care | Payer: Medicare Other | Source: Ambulatory Visit

## 2011-02-28 ENCOUNTER — Ambulatory Visit (HOSPITAL_COMMUNITY): Payer: Medicare Other

## 2011-02-28 DIAGNOSIS — R1319 Other dysphagia: Secondary | ICD-10-CM | POA: Insufficient documentation

## 2011-02-28 NOTE — Procedures (Signed)
Modified Barium Swallow Procedure Note Patient Details  Name: Wyatt Rodriguez MRN: [id number] Date of Birth: 10-Jun-1930  Today's Date: 02/28/2011 Time:  -     Past Medical History: No past medical history on file. Past Surgical History: No past surgical history on file. HPI:  Pt is a 78 year old male arriving for an outpatient MBS from SNF. There is no hisotry provided regarding pts history or details regarding aspiraiton concern. Pt was admitted to [facility] in 4/12 with pna. Employee from SNF present with pt reports pt coughs while he is drinking. Pt appears to have dementia and cannot provide reliable history.     Recommendation/Prognosis  Clinical Impression Dysphagia Diagnosis: Suspected primary esophageal dysphagia Clinical impression: Pt presents with oral and oropharyngeal swallow WFL. Pt with no penetration or aspiraiton observed during this study though pt consistently coughed after each swallow. Pt with mild premature spillage of thin bolus due to oral residual, decreased oral containment of large boluses. Again, no penetration observed. Esopahgeal sweep revealed appearance of slow emptying of thin barium through LES with some retrograde movement of stasis. The pt may experience deferred sensation of this stasis with a reflux cough. Would recommend the pt continue a regular diet with thin liquids. F/u with MD for assessment for possible GER.  Swallow Evaluation Recommendations Recommended Consults: Consider GI evaluation Solid Consistency: Regular Liquid Consistency: Thin Liquid Administration via: Cup;Straw Medication Administration: Whole meds with liquid Postural Changes and/or Swallow Maneuvers: Upright 30-60 min after meal Follow up Recommendations: None   Individuals Consulted Report Sent to : Referring physician;Primary SLP  General:  HPI: Pt is a 78 year old male arriving for an outpatient MBS from SNF. There is no hisotry provided regarding pts history or details regarding  aspiraiton concern. Pt was admitted to [facility] in 4/12 with pna. Employee from SNF present with pt reports pt coughs while he is drinking. Pt appears to have dementia and cannot provide reliable history.  Type of Study: Initial MBS Diet Prior to this Study: Regular;Thin liquids Behavior/Cognition: Alert;Cooperative;Pleasant mood;Confused Oral Cavity - Dentition: Adequate natural dentition Oral Motor / Sensory Function: Within functional limits Vision: Functional for self-feeding Patient Positioning: Upright in chair Baseline Vocal Quality: Clear Volitional Cough: Strong Volitional Swallow: Able to elicit Anatomy: Within functional limits Pharyngeal Secretions: Not observed secondary MBS  Oral Phase Oral Preparation/Oral Phase Oral Phase: WFL Pharyngeal Phase  Pharyngeal Phase Pharyngeal Phase: Impaired Pharyngeal - Thin Pharyngeal - Thin Cup: Premature spillage to pyriform Pharyngeal - Thin Straw: Premature spillage to pyriform Pharyngeal - Solids Pharyngeal - Puree: Within functional limits Pharyngeal - Regular: Within functional limits Pharyngeal - Pill: Within functional limits (Pt could not transit with thin bolus. dry swallow) Cervical Esophageal Phase  Cervical Esophageal Phase Cervical Esophageal Phase: Impaired Cervical Esophageal Phase - Comment Cervical Esophageal Comment: Appearance of osteophyte on cervical spine at level of UES. Asymptomatic  Harlon Ditty, MA CCC-SLP [phone]  Claudine Mouton 02/28/2011, 2:03 PM

## 2011-05-09 ENCOUNTER — Encounter (HOSPITAL_BASED_OUTPATIENT_CLINIC_OR_DEPARTMENT_OTHER): Payer: Self-pay | Admitting: *Deleted

## 2011-05-09 ENCOUNTER — Emergency Department (HOSPITAL_BASED_OUTPATIENT_CLINIC_OR_DEPARTMENT_OTHER)
Admission: EM | Admit: 2011-05-09 | Discharge: 2011-05-09 | Disposition: A | Discharge status: Skilled Nursing Facility | Payer: Medicare Other | Attending: Emergency Medicine | Admitting: Emergency Medicine

## 2011-05-09 DIAGNOSIS — S61411A Laceration without foreign body of right hand, initial encounter: Secondary | ICD-10-CM

## 2011-05-09 DIAGNOSIS — S61409A Unspecified open wound of unspecified hand, initial encounter: Secondary | ICD-10-CM | POA: Insufficient documentation

## 2011-05-09 DIAGNOSIS — Z7982 Long term (current) use of aspirin: Secondary | ICD-10-CM | POA: Insufficient documentation

## 2011-05-09 DIAGNOSIS — E079 Disorder of thyroid, unspecified: Secondary | ICD-10-CM | POA: Insufficient documentation

## 2011-05-09 DIAGNOSIS — Y9210 Unspecified residential institution as the place of occurrence of the external cause: Secondary | ICD-10-CM | POA: Insufficient documentation

## 2011-05-09 DIAGNOSIS — Z79899 Other long term (current) drug therapy: Secondary | ICD-10-CM | POA: Insufficient documentation

## 2011-05-09 DIAGNOSIS — F0280 Dementia in other diseases classified elsewhere without behavioral disturbance: Secondary | ICD-10-CM | POA: Insufficient documentation

## 2011-05-09 DIAGNOSIS — G309 Alzheimer's disease, unspecified: Secondary | ICD-10-CM | POA: Insufficient documentation

## 2011-05-09 DIAGNOSIS — I10 Essential (primary) hypertension: Secondary | ICD-10-CM | POA: Insufficient documentation

## 2011-05-09 HISTORY — DX: Alzheimer's disease, unspecified: G30.9

## 2011-05-09 HISTORY — DX: Abdominal aortic aneurysm, without rupture, unspecified: I71.40

## 2011-05-09 HISTORY — DX: Abdominal aortic aneurysm, without rupture: I71.4

## 2011-05-09 HISTORY — DX: Disorder of kidney and ureter, unspecified: N28.9

## 2011-05-09 HISTORY — DX: Essential (primary) hypertension: I10

## 2011-05-09 HISTORY — DX: Bradycardia, unspecified: R00.1

## 2011-05-09 HISTORY — DX: Disorder of thyroid, unspecified: E07.9

## 2011-05-09 HISTORY — DX: Dementia in other diseases classified elsewhere, unspecified severity, without behavioral disturbance, psychotic disturbance, mood disturbance, and anxiety: F02.80

## 2011-05-09 MED ORDER — TETANUS-DIPHTH-ACELL PERTUSSIS 5-2.5-18.5 LF-MCG/0.5 IM SUSP
0.5000 mL | Freq: Once | INTRAMUSCULAR | Status: AC
  Administered 2011-05-09: 0.5 mL via INTRAMUSCULAR
  Filled 2011-05-09: qty 0.5

## 2011-05-09 MED ORDER — AMOXICILLIN-POT CLAVULANATE 875-125 MG PO TABS: 1.0000 | ORAL_TABLET | Freq: Two times a day (BID) | ORAL | Status: AC
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
## 2011-05-09 NOTE — Discharge Instructions (Signed)
Human Bite °Human bite wounds tend to become infected, even when they seem minor at first. Bite wounds of the hand can be serious because the tendons and joints are close to the skin. Infection can develop very rapidly, even in a matter of hours.  °DIAGNOSIS  °Your caregiver will most likely: °· Take a detailed history of the bite injury.  °· Perform a wound exam.  °· Take your medical history.  °Blood tests or X-rays may be performed. Sometimes, infected bite wounds are cultured and sent to a lab to identify the infectious bacteria. °TREATMENT  °Medical treatment will depend on the location of the bite as well as the patient's medical history. Treatment may include: °· Wound care, such as cleaning and flushing the wound with saline solution, bandaging, and elevating the affected area.  °· Antibiotic medicine.  °· Tetanus immunization.  °· Leaving the wound open to heal. This is often done with human bites due to the high risk of infection. However, in certain cases, wound closure with stitches, wound adhesive, skin adhesive strips, or staples may be used.  °Infected bites that are left untreated may require intravenous (IV) antibiotics and surgical treatment in the hospital. °HOME CARE INSTRUCTIONS °· Follow your caregiver's instructions for wound care.  °· Take all medicines as directed.  °· If your caregiver prescribes antibiotics, take them as directed. Finish them even if you start to feel better.  °· Follow up with your caregiver for further exams or immunizations as directed.  °You may need a tetanus shot if: °· You cannot remember when you had your last tetanus shot.  °· You have never had a tetanus shot.  °· The injury broke your skin.  °If you get a tetanus shot, your arm may swell, get red, and feel warm to the touch. This is common and not a problem. If you need a tetanus shot and you choose not to have one, there is a rare chance of getting tetanus. Sickness from tetanus can be serious. °SEEK IMMEDIATE  MEDICAL CARE IF: °· You have increased pain, swelling, or redness around the bite wound.  °· You have chills.  °· You have a fever.  °· You have pus draining from the wound.  °· You have red streaks on the skin coming from the wound.  °· You have pain with movement or trouble moving the injured part.  °· You are not improving, or you are getting worse.  °· You have any other questions or concerns.  °MAKE SURE YOU: °· Understand these instructions.  °· Will watch your condition.  °· Will get help right away if you are not doing well or get worse.  °Document Released: 02/15/2004 Document Revised: 12/27/2010 Document Reviewed: 08/29/2010 °Exit[employer]® Patient Information ©2012 [employer]. °
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
## 2011-05-09 NOTE — ED Notes (Signed)
[facility] contacted for transport 

## 2011-05-11 NOTE — ED Provider Notes (Signed)
Medical screening examination/treatment/procedure(s) were performed by non-physician practitioner and as supervising physician I was immediately available for consultation/collaboration.  Aden Youngman, MD 05/11/11 0737 

## 2011-10-01 ENCOUNTER — Encounter (HOSPITAL_BASED_OUTPATIENT_CLINIC_OR_DEPARTMENT_OTHER): Payer: Self-pay | Admitting: Student

## 2011-10-01 ENCOUNTER — Emergency Department (HOSPITAL_BASED_OUTPATIENT_CLINIC_OR_DEPARTMENT_OTHER)
Admission: EM | Admit: 2011-10-01 | Discharge: 2011-10-01 | Disposition: A | Discharge status: Skilled Nursing Facility | Payer: Medicare Other | Attending: Emergency Medicine | Admitting: Emergency Medicine

## 2011-10-01 DIAGNOSIS — N289 Disorder of kidney and ureter, unspecified: Secondary | ICD-10-CM | POA: Insufficient documentation

## 2011-10-01 DIAGNOSIS — E079 Disorder of thyroid, unspecified: Secondary | ICD-10-CM | POA: Insufficient documentation

## 2011-10-01 DIAGNOSIS — I10 Essential (primary) hypertension: Secondary | ICD-10-CM | POA: Insufficient documentation

## 2011-10-01 DIAGNOSIS — F0280 Dementia in other diseases classified elsewhere without behavioral disturbance: Secondary | ICD-10-CM

## 2011-10-01 DIAGNOSIS — F603 Borderline personality disorder: Secondary | ICD-10-CM | POA: Insufficient documentation

## 2011-10-01 DIAGNOSIS — G309 Alzheimer's disease, unspecified: Secondary | ICD-10-CM | POA: Insufficient documentation

## 2011-10-01 LAB — BASIC METABOLIC PANEL
GFR calc Af Amer: 59 mL/min — ABNORMAL LOW (ref >90)
GFR calc non Af Amer: 51 mL/min — ABNORMAL LOW (ref >90)
Potassium: 4.2 mEq/L (ref 3.5–5.1)
Sodium: 140 mEq/L (ref 135–145)

## 2011-10-01 LAB — URINALYSIS, ROUTINE W REFLEX MICROSCOPIC
Bilirubin Urine: NEGATIVE
Glucose, UA: NEGATIVE mg/dL
Hgb urine dipstick: NEGATIVE
Ketones, ur: NEGATIVE mg/dL
Protein, ur: NEGATIVE mg/dL

## 2011-10-01 NOTE — ED Provider Notes (Signed)
History     CSN: [id number]  Arrival date & time 10/01/11  1029   First MD Initiated Contact with Patient 10/01/11 1057      Chief Complaint  Patient presents with  . Aggressive Behavior    (Consider location/radiation/quality/duration/timing/severity/associated sxs/prior treatment) The history is provided by the patient and a relative.   Patient here from nursing home with possible urinary tract infection. Has history of Alzheimer's and is more aggressive today. No recent fever, cough, head injury. According to his relative he says baseline at this time. New medication started. Nothing makes his symptoms better or worse. Past Medical History  Diagnosis Date  . Thyroid disease   . Abdominal aneurysm   . Alzheimer's dementia   . Hypertension   . Renal disorder   . Bradycardia     History reviewed. No pertinent past surgical history.  History reviewed. No pertinent family history.  History  Substance Use Topics  . Smoking status: Not on file  . Smokeless tobacco: Not on file  . Alcohol Use: No      Review of Systems  All other systems reviewed and are negative.    Allergies  Review of patient's allergies indicates no known allergies.  Home Medications   Current Outpatient Rx  Name Route Sig Dispense Refill  . ATORVASTATIN CALCIUM 10 MG PO TABS Oral Take 10 mg by mouth daily.    Marland Kitchen CALCIUM CARBONATE-VITAMIN D 500-200 MG-UNIT PO TABS Oral Take 2 tablets by mouth daily.    Marland Kitchen OMEPRAZOLE 20 MG PO CPDR Oral Take 20 mg by mouth daily.    Marland Kitchen PENTOXIFYLLINE ER 400 MG PO TBCR Oral Take 400 mg by mouth 3 (three) times daily with meals.    . ASPIRIN 81 MG PO TABS Oral Take 81 mg by mouth daily.    Marland Kitchen FINASTERIDE 5 MG PO TABS Oral Take 5 mg by mouth daily.    Marland Kitchen METOPROLOL TARTRATE 25 MG PO TABS Oral Take 25 mg by mouth 2 (two) times daily.    Marland Kitchen POTASSIUM CHLORIDE CRYS ER 10 MEQ PO TBCR Oral Take 10 mEq by mouth 2 (two) times daily.      BP 121/61  Pulse 75  Temp 98.2 F  (36.8 C) (Oral)  Resp 16  SpO2 97%  Physical Exam  Nursing note and vitals reviewed. Constitutional: He is oriented to person, place, and time. He appears well-developed and well-nourished.  Non-toxic appearance. No distress.  HENT:  Head: Normocephalic and atraumatic.  Eyes: Conjunctivae, EOM and lids are normal. Pupils are equal, round, and reactive to light.  Neck: Normal range of motion. Neck supple. No tracheal deviation present. No mass present.  Cardiovascular: Normal rate, regular rhythm and normal heart sounds.  Exam reveals no gallop.   No murmur heard. Pulmonary/Chest: Effort normal and breath sounds normal. No stridor. No respiratory distress. He has no decreased breath sounds. He has no wheezes. He has no rhonchi. He has no rales.  Abdominal: Soft. Normal appearance and bowel sounds are normal. He exhibits no distension. There is no tenderness. There is no rebound and no CVA tenderness.  Musculoskeletal: Normal range of motion. He exhibits no edema and no tenderness.  Neurological: He is alert and oriented to person, place, and time. He has normal strength. No cranial nerve deficit or sensory deficit. GCS eye subscore is 4. GCS verbal subscore is 5. GCS motor subscore is 6.  Skin: Skin is warm and dry. No abrasion and no rash noted.  Psychiatric:  He has a normal mood and affect. His speech is normal and behavior is normal.    ED Course  Procedures (including critical care time)   Labs Reviewed  URINALYSIS, ROUTINE W REFLEX MICROSCOPIC  URINE CULTURE   No results found.   No diagnosis found.    MDM  Patient monitored here and no signs of increased agitation. Suspect episode today was likely to his worsening Alzheimer's.        Toy Baker, MD 10/01/11 1416
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
## 2011-10-01 NOTE — ED Notes (Signed)
Pt given water and encouraged to drink fluids. Pt given urinal and understood explanation for need for urine sample.

## 2011-10-02 LAB — URINE CULTURE: Colony Count: 30000

## 2012-07-01 ENCOUNTER — Encounter (HOSPITAL_BASED_OUTPATIENT_CLINIC_OR_DEPARTMENT_OTHER): Payer: Self-pay

## 2012-07-01 ENCOUNTER — Emergency Department (HOSPITAL_BASED_OUTPATIENT_CLINIC_OR_DEPARTMENT_OTHER)
Admission: EM | Admit: 2012-07-01 | Discharge: 2012-07-01 | Disposition: A | Discharge status: Home / Self Care | Payer: Medicare Other | Attending: Emergency Medicine | Admitting: Emergency Medicine

## 2012-07-01 ENCOUNTER — Emergency Department (HOSPITAL_BASED_OUTPATIENT_CLINIC_OR_DEPARTMENT_OTHER): Payer: Medicare Other

## 2012-07-01 DIAGNOSIS — S0093XA Contusion of unspecified part of head, initial encounter: Secondary | ICD-10-CM

## 2012-07-01 DIAGNOSIS — I10 Essential (primary) hypertension: Secondary | ICD-10-CM | POA: Insufficient documentation

## 2012-07-01 DIAGNOSIS — Y9210 Unspecified residential institution as the place of occurrence of the external cause: Secondary | ICD-10-CM | POA: Insufficient documentation

## 2012-07-01 DIAGNOSIS — IMO0002 Reserved for concepts with insufficient information to code with codable children: Secondary | ICD-10-CM | POA: Insufficient documentation

## 2012-07-01 DIAGNOSIS — I498 Other specified cardiac arrhythmias: Secondary | ICD-10-CM | POA: Insufficient documentation

## 2012-07-01 DIAGNOSIS — Z7982 Long term (current) use of aspirin: Secondary | ICD-10-CM | POA: Insufficient documentation

## 2012-07-01 DIAGNOSIS — Z8639 Personal history of other endocrine, nutritional and metabolic disease: Secondary | ICD-10-CM | POA: Insufficient documentation

## 2012-07-01 DIAGNOSIS — Z8679 Personal history of other diseases of the circulatory system: Secondary | ICD-10-CM | POA: Insufficient documentation

## 2012-07-01 DIAGNOSIS — G309 Alzheimer's disease, unspecified: Secondary | ICD-10-CM | POA: Insufficient documentation

## 2012-07-01 DIAGNOSIS — R296 Repeated falls: Secondary | ICD-10-CM | POA: Insufficient documentation

## 2012-07-01 DIAGNOSIS — Z862 Personal history of diseases of the blood and blood-forming organs and certain disorders involving the immune mechanism: Secondary | ICD-10-CM | POA: Insufficient documentation

## 2012-07-01 DIAGNOSIS — F0280 Dementia in other diseases classified elsewhere without behavioral disturbance: Secondary | ICD-10-CM | POA: Insufficient documentation

## 2012-07-01 DIAGNOSIS — Y939 Activity, unspecified: Secondary | ICD-10-CM | POA: Insufficient documentation

## 2012-07-01 DIAGNOSIS — Z79899 Other long term (current) drug therapy: Secondary | ICD-10-CM | POA: Insufficient documentation

## 2012-07-01 DIAGNOSIS — S0003XA Contusion of scalp, initial encounter: Secondary | ICD-10-CM | POA: Insufficient documentation

## 2012-07-01 DIAGNOSIS — Z87448 Personal history of other diseases of urinary system: Secondary | ICD-10-CM | POA: Insufficient documentation

## 2012-07-01 NOTE — ED Notes (Signed)
PO fluids provided. 

## 2012-07-01 NOTE — ED Notes (Signed)
Bruise to right side of forehead.  Unknown injury.

## 2012-07-01 NOTE — ED Notes (Signed)
Pt dressed an assisted to w/c.  Pt is at nsg station in view of staff.

## 2012-07-01 NOTE — ED Provider Notes (Signed)
History     CSN: [id number]  Arrival date & time 07/01/12  1114   First MD Initiated Contact with Patient 07/01/12 1117      Chief Complaint  Patient presents with  . Bruise on head     (Consider location/radiation/quality/duration/timing/severity/associated sxs/prior treatment) HPI LEVEL 5 CAVEAT PERTAINS DUE TO DEMENTIA.   Pt presents from nursing facility with bruising noted to right side of his head.  He is unsure whether he fell.  Denies headache.  No vomiting or seizure activity reported.  He has scattered similar ecchymoses all over his arms and legs.  Not on blood thinners according to chart.  Denies neck or back pain.    Past Medical History  Diagnosis Date  . Thyroid disease   . Abdominal aneurysm   . Alzheimer's dementia   . Hypertension   . Renal disorder   . Bradycardia     History reviewed. No pertinent past surgical history.  No family history on file.  History  Substance Use Topics  . Smoking status: Not on file  . Smokeless tobacco: Not on file  . Alcohol Use: No      Review of Systems UNABLE TO OBTAIN ROS DUE TO LEVEL 5 CAVEAT  Allergies  Review of patient's allergies indicates no known allergies.  Home Medications   Current Outpatient Rx  Name  Route  Sig  Dispense  Refill  . Cholecalciferol (VITAMIN D-3 PO)   Oral   Take by mouth.         . divalproex (DEPAKOTE SPRINKLE) 125 MG capsule   Oral   Take 125 mg by mouth 2 (two) times daily.         . fluticasone (FLONASE) 50 MCG/ACT nasal spray   Nasal   Place 2 sprays into the nose daily.         Marland Kitchen levofloxacin (LEVAQUIN) 500 MG tablet   Oral   Take 500 mg by mouth daily.         . Multiple Vitamin (MULTIVITAMIN) capsule   Oral   Take 1 capsule by mouth daily.         . pantoprazole (PROTONIX) 40 MG tablet   Oral   Take 40 mg by mouth daily.         . pravastatin (PRAVACHOL) 40 MG tablet   Oral   Take 40 mg by mouth daily.         Marland Kitchen venlafaxine (EFFEXOR) 37.5  MG tablet   Oral   Take 37.5 mg by mouth 2 (two) times daily.         Marland Kitchen aspirin 81 MG tablet   Oral   Take 81 mg by mouth daily.         Marland Kitchen atorvastatin (LIPITOR) 10 MG tablet   Oral   Take 10 mg by mouth daily.         . calcium-vitamin D (OSCAL WITH D) 500-200 MG-UNIT per tablet   Oral   Take 2 tablets by mouth daily.         . finasteride (PROSCAR) 5 MG tablet   Oral   Take 5 mg by mouth daily.         . metoprolol tartrate (LOPRESSOR) 25 MG tablet   Oral   Take 25 mg by mouth 2 (two) times daily.         Marland Kitchen omeprazole (PRILOSEC) 20 MG capsule   Oral   Take 20 mg by mouth daily.         Marland Kitchen  pentoxifylline (TRENTAL) 400 MG CR tablet   Oral   Take 400 mg by mouth 3 (three) times daily with meals.         . potassium chloride (K-DUR,KLOR-CON) 10 MEQ tablet   Oral   Take 10 mEq by mouth 2 (two) times daily.           BP 120/66  Pulse 78  Temp(Src) 97.9 F (36.6 C) (Oral)  Resp 16  SpO2 100% Vitals reviewed Physical Exam Physical Examination: General appearance - alert, well appearing, and in no distress Mental status - alert, oriented to person, place, and time Head- [place], approx 1-2cm ecchymosis overlying left scalp, no hematoma, nontender, no stepoffs Eyes - pupils equal and reactive, extraocular eye movements intact, no conjunctival injection Ears - bilateral TM's and external ear canals normal, no hemotympanum Mouth - mucous membranes moist, pharynx normal without lesions Neck - supple, no midline tenderness Chest - clear to auscultation, no wheezes, rales or rhonchi, symmetric air entry Heart - normal rate, regular rhythm, normal S1, S2, no murmurs, rubs, clicks or gallops Abdomen - soft, nontender, nondistended, no masses or organomegaly Back exam - full range of motion, no midline tenderness, no CVA tenderness Extremities - peripheral pulses normal, no pedal edema, no clubbing or cyanosis Skin - normal coloration and turgor, no rashes  ED  Course  Procedures (including critical care time)  Labs Reviewed - No data to display Ct Head Wo Contrast  07/01/2012   *RADIOLOGY REPORT*  Clinical Data: Right forehead bruising. Dementia.  CT HEAD WITHOUT CONTRAST  Technique:  Contiguous axial images were obtained from the base of the skull through the vertex without contrast.  Comparison: 04/05/2003.  Findings: No evidence of acute infarct, acute hemorrhage, mass lesion, mass effect or hydrocephalus.  Atrophy.  Periventricular low attenuation.  Visualized portions of the paranasal sinuses and mastoid air cells are clear.  No fracture.  IMPRESSION:  1.  No acute findings. 2.  Atrophy and chronic microvascular white matter ischemic changes.   Original Report Authenticated By: Leanna Battles, M.D.     1. Contusion of head, initial encounter       MDM  Pt presenting with c/o bruising overlying his right scalp noted by nursing staff.  Head CT c/w chronic changes but no acute findings.  Pt appears to be at his baseline mental status with hx of dementia.  Discharged with strict return precautions.  Pt agreeable with plan.        Ethelda Chick, MD 07/01/12 [phone]

## 2012-07-01 NOTE — ED Notes (Signed)
Patient dressed ready for discharge.

## 2012-07-01 NOTE — ED Notes (Signed)
Awaiting [facility]. 

## 2012-07-03 ENCOUNTER — Other Ambulatory Visit (HOSPITAL_COMMUNITY): Payer: Self-pay | Admitting: Geriatric Medicine

## 2012-07-03 DIAGNOSIS — R1310 Dysphagia, unspecified: Secondary | ICD-10-CM

## 2012-07-16 ENCOUNTER — Ambulatory Visit (HOSPITAL_COMMUNITY)
Admission: RE | Admit: 2012-07-16 | Discharge: 2012-07-16 | Disposition: A | Discharge status: Home / Self Care | Payer: Medicare Other | Source: Ambulatory Visit | Attending: Geriatric Medicine | Admitting: Geriatric Medicine

## 2012-07-16 DIAGNOSIS — I10 Essential (primary) hypertension: Secondary | ICD-10-CM | POA: Insufficient documentation

## 2012-07-16 DIAGNOSIS — R1311 Dysphagia, oral phase: Secondary | ICD-10-CM | POA: Insufficient documentation

## 2012-07-16 DIAGNOSIS — K219 Gastro-esophageal reflux disease without esophagitis: Secondary | ICD-10-CM | POA: Insufficient documentation

## 2012-07-16 DIAGNOSIS — G309 Alzheimer's disease, unspecified: Secondary | ICD-10-CM | POA: Insufficient documentation

## 2012-07-16 DIAGNOSIS — R1313 Dysphagia, pharyngeal phase: Secondary | ICD-10-CM | POA: Insufficient documentation

## 2012-07-16 DIAGNOSIS — R1312 Dysphagia, oropharyngeal phase: Secondary | ICD-10-CM | POA: Insufficient documentation

## 2012-07-16 DIAGNOSIS — R05 Cough: Secondary | ICD-10-CM | POA: Insufficient documentation

## 2012-07-16 DIAGNOSIS — F0280 Dementia in other diseases classified elsewhere without behavioral disturbance: Secondary | ICD-10-CM | POA: Insufficient documentation

## 2012-07-16 DIAGNOSIS — R059 Cough, unspecified: Secondary | ICD-10-CM | POA: Insufficient documentation

## 2012-07-16 DIAGNOSIS — R1310 Dysphagia, unspecified: Secondary | ICD-10-CM

## 2012-07-16 NOTE — Procedures (Addendum)
Objective Swallowing Evaluation: Modified Barium Swallowing Study  Patient Details  Name: HYMEN ARNETT MRN: [id number] Date of Birth: 1930/10/17  Today's Date: 07/16/2012 Time: 1324-4010 SLP Time Calculation (min): 35 min  Past Medical History:  Past Medical History  Diagnosis Date  . Thyroid disease   . Abdominal aneurysm   . Alzheimer's dementia   . Hypertension   . Renal disorder   . Bradycardia    Past Surgical History: No past surgical history on file. HPI:  79 y.o. male SNF resident referred for outpt MBS by SNF SLP due to delayed coughing after meals and recurrent pna. PMH + AD, GERD, pna HTN, BPH, CAD s/p CABG.     Assessment / Plan / Recommendation Clinical Impression  Dysphagia Diagnosis: Mild pharyngeal phase dysphagia;Mild oral phase dysphagia Clinical impression: Mild oropharyngeal dysphagia. Mr. Thorstenson tolerated greater than 25 successive thin liquds sips via straw and cup with no penetration or aspiration. One episode of silent aspiration occurrred with thin liquid cup sips taken to clear a chewed barium pill. Mr. Boule is at risk for intermittent asipiration due to confusion and impulsivity. He did not consistently follow commands to cough to clear aspiration. Cough noted prior to and during the MBS in absence of aspiration.    Treatment Recommendation  Defer treatment plan to SLP at (Comment) (SNF)    Diet Recommendation Dysphagia 3 (Mechanical Soft);Thin liquid   Liquid Administration via: Cup;Straw Medication Administration: Crushed with puree Supervision: Intermittent supervision to cue for compensatory strategies Compensations: Slow rate;Small sips/bites (clear mouth prior to liquid sips, no mixed consistencies) Postural Changes and/or Swallow Maneuvers: Out of bed for meals;Seated upright 90 degrees;Upright 30-60 min after meal    Other  Recommendations Oral Care Recommendations: Oral care BID   Follow Up Recommendations  Assisted Living    Frequency  and Duration        Pertinent Vitals/Pain None reported    SLP Swallow Goals Patient will consume recommended diet without observed clinical signs of aspiration with: Moderate assistance Swallow Study Goal #1 - Progress: Progressing toward goal Patient will utilize recommended strategies during swallow to increase swallowing safety with: Maximum assistance Swallow Study Goal #2 - Progress: Progressing toward goal   General Date of Onset: 06/30/12 HPI: 80 y.o. male SNF resident referred for outpt MBS by SNF SLP due to delayed coughing after meals and recurrent pna. PMH + AD, GERD, pna HTN, BPH, CAD s/p CABG. Type of Study: Modified Barium Swallowing Study Reason for Referral: Objectively evaluate swallowing function Previous Swallow Assessment: February 2013 revealed normal oropharyngeal swallow, thin liquids and regular solids recommended Diet Prior to this Study: Regular;Thin liquids Temperature Spikes Noted: No Respiratory Status: Room air History of Recent Intubation: No Behavior/Cognition: Alert;Pleasant mood;Confused Oral Cavity - Dentition: Adequate natural dentition Oral Motor / Sensory Function: Within functional limits Self-Feeding Abilities: Needs assist Patient Positioning: Upright in chair Baseline Vocal Quality: Clear Volitional Cough: Strong Volitional Swallow: Unable to elicit Anatomy: Within functional limits Pharyngeal Secretions: Not observed secondary MBS    Reason for Referral Objectively evaluate swallowing function   Oral Phase Oral Preparation/Oral Phase Oral Phase: WFL   Pharyngeal Phase Pharyngeal Phase Pharyngeal Phase: Impaired Pharyngeal - Thin Pharyngeal - Thin Cup: Premature spillage to valleculae Penetration/Aspiration details (thin cup): Material does not enter airway Pharyngeal - Thin Straw: Premature spillage to valleculae;Pharyngeal residue - valleculae Pharyngeal - Solids Pharyngeal - Puree: Within functional limits Pharyngeal -  Mechanical Soft: Within functional limits Pharyngeal - Pill: Penetration/Aspiration during swallow Penetration/Aspiration details (  pill): Material enters airway, passes BELOW cords without attempt by patient to eject out (silent aspiration) Pharyngeal Phase - Comment Pharyngeal Comment: Mr. Methot tolerate 25+ successive thin liquid sips via straw and cup with no aspiration or penetration. One episode of silent aspiration when attempting to clear chewed pill from his mouth.  Cervical Esophageal Phase    GO    Cervical Esophageal Phase Cervical Esophageal Phase: [facility]    Functional Assessment Tool Used: clinical judgement Functional Limitations: Swallowing Swallow Current Status ([id number]): At least 20 percent but less than 40 percent impaired, limited or restricted Swallow Goal Status [phone]): At least 20 percent but less than 40 percent impaired, limited or restricted Swallow Discharge Status [phone]): At least 20 percent but less than 40 percent impaired, limited or restricted    Lovvorn, Radene Journey 07/16/2012, 12:44 PM

## 2012-09-19 ENCOUNTER — Emergency Department (HOSPITAL_BASED_OUTPATIENT_CLINIC_OR_DEPARTMENT_OTHER)
Admission: EM | Admit: 2012-09-19 | Discharge: 2012-09-20 | Disposition: A | Discharge status: Home / Self Care | Payer: Medicare Other | Attending: Emergency Medicine | Admitting: Emergency Medicine

## 2012-09-19 ENCOUNTER — Encounter (HOSPITAL_BASED_OUTPATIENT_CLINIC_OR_DEPARTMENT_OTHER): Payer: Self-pay | Admitting: *Deleted

## 2012-09-19 DIAGNOSIS — IMO0002 Reserved for concepts with insufficient information to code with codable children: Secondary | ICD-10-CM | POA: Insufficient documentation

## 2012-09-19 DIAGNOSIS — T798XXA Other early complications of trauma, initial encounter: Secondary | ICD-10-CM

## 2012-09-19 DIAGNOSIS — N390 Urinary tract infection, site not specified: Secondary | ICD-10-CM | POA: Insufficient documentation

## 2012-09-19 DIAGNOSIS — S51809A Unspecified open wound of unspecified forearm, initial encounter: Secondary | ICD-10-CM | POA: Insufficient documentation

## 2012-09-19 DIAGNOSIS — Z8639 Personal history of other endocrine, nutritional and metabolic disease: Secondary | ICD-10-CM | POA: Insufficient documentation

## 2012-09-19 DIAGNOSIS — W2209XA Striking against other stationary object, initial encounter: Secondary | ICD-10-CM | POA: Insufficient documentation

## 2012-09-19 DIAGNOSIS — I10 Essential (primary) hypertension: Secondary | ICD-10-CM | POA: Insufficient documentation

## 2012-09-19 DIAGNOSIS — Z7982 Long term (current) use of aspirin: Secondary | ICD-10-CM | POA: Insufficient documentation

## 2012-09-19 DIAGNOSIS — Z862 Personal history of diseases of the blood and blood-forming organs and certain disorders involving the immune mechanism: Secondary | ICD-10-CM | POA: Insufficient documentation

## 2012-09-19 DIAGNOSIS — F0280 Dementia in other diseases classified elsewhere without behavioral disturbance: Secondary | ICD-10-CM | POA: Insufficient documentation

## 2012-09-19 DIAGNOSIS — G309 Alzheimer's disease, unspecified: Secondary | ICD-10-CM | POA: Insufficient documentation

## 2012-09-19 DIAGNOSIS — Z792 Long term (current) use of antibiotics: Secondary | ICD-10-CM | POA: Insufficient documentation

## 2012-09-19 DIAGNOSIS — Z79899 Other long term (current) drug therapy: Secondary | ICD-10-CM | POA: Insufficient documentation

## 2012-09-19 DIAGNOSIS — Y9210 Unspecified residential institution as the place of occurrence of the external cause: Secondary | ICD-10-CM | POA: Insufficient documentation

## 2012-09-19 DIAGNOSIS — F0390 Unspecified dementia without behavioral disturbance: Secondary | ICD-10-CM

## 2012-09-19 DIAGNOSIS — Y939 Activity, unspecified: Secondary | ICD-10-CM | POA: Insufficient documentation

## 2012-09-19 LAB — COMPREHENSIVE METABOLIC PANEL
AST: 22 U/L (ref 0–37)
CO2: 30 mEq/L (ref 19–32)
Calcium: 10.3 mg/dL (ref 8.4–10.5)
Creatinine, Ser: 1.8 mg/dL — ABNORMAL HIGH (ref 0.50–1.35)
GFR calc Af Amer: 39 mL/min — ABNORMAL LOW (ref >90)
GFR calc non Af Amer: 34 mL/min — ABNORMAL LOW (ref >90)
Glucose, Bld: 148 mg/dL — ABNORMAL HIGH (ref 70–99)
Total Protein: 6.8 g/dL (ref 6.0–8.3)

## 2012-09-19 LAB — URINE MICROSCOPIC-ADD ON

## 2012-09-19 LAB — CBC WITH DIFFERENTIAL/PLATELET
Basophils Absolute: 0.1 10*3/uL (ref 0.0–0.1)
Eosinophils Absolute: 0.5 10*3/uL (ref 0.0–0.7)
Eosinophils Relative: 6 % — ABNORMAL HIGH (ref 0–5)
Hemoglobin: 11.9 g/dL — ABNORMAL LOW (ref 13.0–17.0)
Lymphs Abs: 2.3 10*3/uL (ref 0.7–4.0)
MCHC: 32.5 g/dL (ref 30.0–36.0)
Monocytes Relative: 11 % (ref 3–12)
Neutro Abs: 4.5 10*3/uL (ref 1.7–7.7)
Neutrophils Relative %: 55 % (ref 43–77)
Platelets: 192 10*3/uL (ref 150–400)
RBC: 3.41 MIL/uL — ABNORMAL LOW (ref 4.22–5.81)

## 2012-09-19 LAB — URINALYSIS, ROUTINE W REFLEX MICROSCOPIC
Nitrite: NEGATIVE
Specific Gravity, Urine: 1.017 (ref 1.005–1.030)
Urobilinogen, UA: 1 mg/dL (ref 0.0–1.0)

## 2012-09-19 MED ORDER — MUPIROCIN CALCIUM 2 % EX CREA: TOPICAL_CREAM | Freq: Three times a day (TID) | CUTANEOUS | Status: AC

## 2012-09-19 MED ORDER — CIPROFLOXACIN HCL 500 MG PO TABS: 500.0000 mg | ORAL_TABLET | Freq: Two times a day (BID) | ORAL | Status: AC

## 2012-09-19 NOTE — ED Notes (Signed)
Pt waiting transport via [facility] current condition is stable
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
## 2012-09-19 NOTE — ED Notes (Addendum)
Condom cath placed to collect urine specimen after unsuccessful attempt to obtain using coude cath by PA Sophia. Pt denies pain and is alert to person. No specimen in collector noted at this time
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
## 2012-09-20 LAB — URINE CULTURE

## 2012-09-30 ENCOUNTER — Emergency Department (HOSPITAL_BASED_OUTPATIENT_CLINIC_OR_DEPARTMENT_OTHER): Payer: Medicare Other

## 2012-09-30 ENCOUNTER — Emergency Department (HOSPITAL_BASED_OUTPATIENT_CLINIC_OR_DEPARTMENT_OTHER)
Admission: EM | Admit: 2012-09-30 | Discharge: 2012-09-30 | Disposition: A | Discharge status: Home / Self Care | Payer: Medicare Other | Attending: Emergency Medicine | Admitting: Emergency Medicine

## 2012-09-30 ENCOUNTER — Encounter (HOSPITAL_BASED_OUTPATIENT_CLINIC_OR_DEPARTMENT_OTHER): Payer: Self-pay

## 2012-09-30 DIAGNOSIS — IMO0002 Reserved for concepts with insufficient information to code with codable children: Secondary | ICD-10-CM | POA: Insufficient documentation

## 2012-09-30 DIAGNOSIS — Z951 Presence of aortocoronary bypass graft: Secondary | ICD-10-CM | POA: Insufficient documentation

## 2012-09-30 DIAGNOSIS — K219 Gastro-esophageal reflux disease without esophagitis: Secondary | ICD-10-CM | POA: Insufficient documentation

## 2012-09-30 DIAGNOSIS — I10 Essential (primary) hypertension: Secondary | ICD-10-CM | POA: Insufficient documentation

## 2012-09-30 DIAGNOSIS — E785 Hyperlipidemia, unspecified: Secondary | ICD-10-CM | POA: Insufficient documentation

## 2012-09-30 DIAGNOSIS — Z87448 Personal history of other diseases of urinary system: Secondary | ICD-10-CM | POA: Insufficient documentation

## 2012-09-30 DIAGNOSIS — Z8701 Personal history of pneumonia (recurrent): Secondary | ICD-10-CM | POA: Insufficient documentation

## 2012-09-30 DIAGNOSIS — S0003XA Contusion of scalp, initial encounter: Secondary | ICD-10-CM | POA: Insufficient documentation

## 2012-09-30 DIAGNOSIS — W2209XA Striking against other stationary object, initial encounter: Secondary | ICD-10-CM | POA: Insufficient documentation

## 2012-09-30 DIAGNOSIS — N400 Enlarged prostate without lower urinary tract symptoms: Secondary | ICD-10-CM | POA: Insufficient documentation

## 2012-09-30 DIAGNOSIS — S0990XA Unspecified injury of head, initial encounter: Secondary | ICD-10-CM

## 2012-09-30 DIAGNOSIS — Z79899 Other long term (current) drug therapy: Secondary | ICD-10-CM | POA: Insufficient documentation

## 2012-09-30 DIAGNOSIS — G309 Alzheimer's disease, unspecified: Secondary | ICD-10-CM | POA: Insufficient documentation

## 2012-09-30 DIAGNOSIS — W06XXXA Fall from bed, initial encounter: Secondary | ICD-10-CM | POA: Insufficient documentation

## 2012-09-30 DIAGNOSIS — Z7982 Long term (current) use of aspirin: Secondary | ICD-10-CM | POA: Insufficient documentation

## 2012-09-30 DIAGNOSIS — Y9289 Other specified places as the place of occurrence of the external cause: Secondary | ICD-10-CM | POA: Insufficient documentation

## 2012-09-30 DIAGNOSIS — F0280 Dementia in other diseases classified elsewhere without behavioral disturbance: Secondary | ICD-10-CM | POA: Insufficient documentation

## 2012-09-30 DIAGNOSIS — Y9389 Activity, other specified: Secondary | ICD-10-CM | POA: Insufficient documentation

## 2012-09-30 DIAGNOSIS — I2510 Atherosclerotic heart disease of native coronary artery without angina pectoris: Secondary | ICD-10-CM | POA: Insufficient documentation

## 2012-09-30 DIAGNOSIS — Z792 Long term (current) use of antibiotics: Secondary | ICD-10-CM | POA: Insufficient documentation

## 2012-09-30 HISTORY — DX: Pneumonia, unspecified organism: J18.9

## 2012-09-30 HISTORY — DX: Hypokalemia: E87.6

## 2012-09-30 HISTORY — DX: Atherosclerotic heart disease of native coronary artery without angina pectoris: I25.10

## 2012-09-30 HISTORY — DX: Hyperlipidemia, unspecified: E78.5

## 2012-09-30 HISTORY — DX: Benign prostatic hyperplasia without lower urinary tract symptoms: N40.0

## 2012-09-30 HISTORY — DX: Gastro-esophageal reflux disease without esophagitis: K21.9

## 2012-09-30 NOTE — ED Notes (Signed)
Pt is alert-states "i don't know" to name, place, age and events PTA-denies pain-bruise noted to left forehead-NAD
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
## 2012-09-30 NOTE — ED Notes (Signed)
Patient transported to CT 

## 2012-09-30 NOTE — ED Provider Notes (Signed)
CSN: [id number]     Arrival date & time 09/30/12  2143 History  This chart was scribed for Charles B. Bernette Mayers, MD by Blanchard Kelch, ED Scribe. The patient was seen in room MH05/MH05. Patient's care was started at 10:20 PM.     Chief Complaint  Patient presents with  . Fall   Level 5 Caveat due to dementia.  Patient is a 79 y.o. male presenting with fall. The history is provided by the patient. No language interpreter was used.  Fall    HPI Comments: Wyatt Rodriguez is a 79 y.o. male who presents to the Emergency Department complaining of a fall out of bed. [facility] hit his head on nightstand. Pt unable to provide any useful history. No reported vomiting or diarrhea.    Past Medical History  Diagnosis Date  . Thyroid disease   . Abdominal aneurysm   . Alzheimer's dementia   . Hypertension   . Renal disorder   . Bradycardia   . Hypokalemia   . BPH (benign prostatic hyperplasia)   . Pneumonia   . Coronary artery disease   . GERD (gastroesophageal reflux disease)   . Hyperlipidemia    Past Surgical History  Procedure Laterality Date  . Coronary artery bypass graft     No family history on file. History  Substance Use Topics  . Smoking status: Not on file  . Smokeless tobacco: Not on file  . Alcohol Use: No    Review of Systems: No ROS obtained due to level 5 caveat (dementia).  Allergies  Review of patient's allergies indicates no known allergies.  Home Medications   Current Outpatient Rx  Name  Route  Sig  Dispense  Refill  . aspirin 81 MG tablet   Oral   Take 81 mg by mouth daily.         Marland Kitchen atorvastatin (LIPITOR) 10 MG tablet   Oral   Take 10 mg by mouth daily.         . calcium-vitamin D (OSCAL WITH D) 500-200 MG-UNIT per tablet   Oral   Take 2 tablets by mouth daily.         . Cholecalciferol (VITAMIN D-3 PO)   Oral   Take by mouth.         . ciprofloxacin (CIPRO) 500 MG tablet   Oral   Take 1 tablet (500 mg total) by mouth 2 (two) times  daily. One po bid x 7 days   14 tablet   0   . divalproex (DEPAKOTE SPRINKLE) 125 MG capsule   Oral   Take 125 mg by mouth 2 (two) times daily.         . Eyelid Cleansers (OCUSOFT LID SCRUB EX)   Apply externally   Apply topically.         . finasteride (PROSCAR) 5 MG tablet   Oral   Take 5 mg by mouth daily.         . fluticasone (FLONASE) 50 MCG/ACT nasal spray   Nasal   Place 2 sprays into the nose daily.         Marland Kitchen levofloxacin (LEVAQUIN) 500 MG tablet   Oral   Take 500 mg by mouth daily.         Marland Kitchen loperamide (IMODIUM) 2 MG capsule   Oral   Take 2 mg by mouth as needed for diarrhea or loose stools.         Marland Kitchen loratadine (CLARITIN) 10 MG tablet  Oral   Take 10 mg by mouth daily.         . metoprolol succinate (TOPROL-XL) 25 MG 24 hr tablet   Oral   Take 12.5 mg by mouth daily.         . metoprolol tartrate (LOPRESSOR) 25 MG tablet   Oral   Take 25 mg by mouth 2 (two) times daily.         . Multiple Vitamin (MULTIVITAMIN) capsule   Oral   Take 1 capsule by mouth daily.         . mupirocin cream (BACTROBAN) 2 %   Topical   Apply topically 3 (three) times daily.   15 g   0   . omeprazole (PRILOSEC) 20 MG capsule   Oral   Take 20 mg by mouth daily.         . pantoprazole (PROTONIX) 40 MG tablet   Oral   Take 40 mg by mouth daily.         . pentoxifylline (TRENTAL) 400 MG CR tablet   Oral   Take 400 mg by mouth 3 (three) times daily with meals.         . potassium chloride (K-DUR,KLOR-CON) 10 MEQ tablet   Oral   Take 10 mEq by mouth 2 (two) times daily.         . pravastatin (PRAVACHOL) 40 MG tablet   Oral   Take 40 mg by mouth daily.         Marland Kitchen venlafaxine (EFFEXOR) 37.5 MG tablet   Oral   Take 37.5 mg by mouth 2 (two) times daily.          Triage Vitals: BP 136/69  Pulse 72  Temp(Src) 97.7 F (36.5 C) (Oral)  Resp 16  SpO2 98%  Physical Exam  Nursing note and vitals reviewed. Constitutional: He appears  well-developed and well-nourished.  HENT:  Head: Normocephalic.  Contusion left forehead.   Eyes: EOM are normal. Pupils are equal, round, and reactive to light.  Neck: Normal range of motion. Neck supple.  Cardiovascular: Normal rate, normal heart sounds and intact distal pulses.   Pulmonary/Chest: Effort normal and breath sounds normal.  Abdominal: Bowel sounds are normal. He exhibits no distension. There is no tenderness.  Musculoskeletal: Normal range of motion. He exhibits no edema and no tenderness.  Neurological: He is alert. He has normal strength. No cranial nerve deficit or sensory deficit.  Skin: Skin is warm and dry. No rash noted.  Psychiatric: He has a normal mood and affect.    ED Course  Procedures (including critical care time)  DIAGNOSTIC STUDIES: Oxygen Saturation is 98% on room air, normal by my interpretation.    COORDINATION OF CARE:  10:22 PM - Patient verbalizes understanding and agrees with treatment plan.    Labs Review Labs Reviewed - No data to display  Imaging Review  Ct Head Wo Contrast  09/30/2012   *RADIOLOGY REPORT*  Clinical Data: Fall with head injury.  Bruising to the left forehead.  History of dementia.  CT HEAD WITHOUT CONTRAST  Technique:  Contiguous axial images were obtained from the base of the skull through the vertex without contrast.  Comparison: 07/01/2012  Findings: Diffuse cerebral atrophy.  Ventricular dilatation likely representing central atrophy.  Patchy low attenuation changes in the deep white matter consistent with small vessel ischemia.  These changes appear stable since previous study.  No mass effect or midline shift.  No abnormal extra-axial fluid collections.  Wallace Cullens-  white matter junctions are distinct.  Basal cisterns are not effaced.  No evidence of acute intracranial hemorrhage.  No depressed skull fractures.  The visualized paranasal sinuses and mastoid air cells are not opacified.  Vascular calcifications.  IMPRESSION:  No acute intracranial abnormalities.  Chronic atrophy and small vessel ischemic changes.   Original Report Authenticated By: Burman Nieves, M.D.    MDM   1. Head injury, acute, initial encounter     Pt with unwitnessed fall from bed with hematoma, but neg CT. Alert but disoriented at baseline. Return to SNF.   I personally performed the services described in this documentation, which was scribed in my presence. The recorded information has been reviewed and is accurate.      Charles B. Bernette Mayers, MD 09/30/12 2258

## 2012-10-20 ENCOUNTER — Encounter (HOSPITAL_BASED_OUTPATIENT_CLINIC_OR_DEPARTMENT_OTHER): Payer: Self-pay | Admitting: Emergency Medicine

## 2012-10-20 ENCOUNTER — Emergency Department (HOSPITAL_BASED_OUTPATIENT_CLINIC_OR_DEPARTMENT_OTHER)
Admission: EM | Admit: 2012-10-20 | Discharge: 2012-10-20 | Disposition: A | Discharge status: Home / Self Care | Payer: Medicare Other | Attending: Emergency Medicine | Admitting: Emergency Medicine

## 2012-10-20 DIAGNOSIS — G309 Alzheimer's disease, unspecified: Secondary | ICD-10-CM | POA: Insufficient documentation

## 2012-10-20 DIAGNOSIS — Z87448 Personal history of other diseases of urinary system: Secondary | ICD-10-CM | POA: Insufficient documentation

## 2012-10-20 DIAGNOSIS — IMO0002 Reserved for concepts with insufficient information to code with codable children: Secondary | ICD-10-CM | POA: Insufficient documentation

## 2012-10-20 DIAGNOSIS — Z043 Encounter for examination and observation following other accident: Secondary | ICD-10-CM | POA: Insufficient documentation

## 2012-10-20 DIAGNOSIS — Y9289 Other specified places as the place of occurrence of the external cause: Secondary | ICD-10-CM | POA: Insufficient documentation

## 2012-10-20 DIAGNOSIS — Z951 Presence of aortocoronary bypass graft: Secondary | ICD-10-CM | POA: Insufficient documentation

## 2012-10-20 DIAGNOSIS — K219 Gastro-esophageal reflux disease without esophagitis: Secondary | ICD-10-CM | POA: Insufficient documentation

## 2012-10-20 DIAGNOSIS — E876 Hypokalemia: Secondary | ICD-10-CM | POA: Insufficient documentation

## 2012-10-20 DIAGNOSIS — Y9389 Activity, other specified: Secondary | ICD-10-CM | POA: Insufficient documentation

## 2012-10-20 DIAGNOSIS — Z79899 Other long term (current) drug therapy: Secondary | ICD-10-CM | POA: Insufficient documentation

## 2012-10-20 DIAGNOSIS — F0280 Dementia in other diseases classified elsewhere without behavioral disturbance: Secondary | ICD-10-CM | POA: Insufficient documentation

## 2012-10-20 DIAGNOSIS — Z8701 Personal history of pneumonia (recurrent): Secondary | ICD-10-CM | POA: Insufficient documentation

## 2012-10-20 DIAGNOSIS — E785 Hyperlipidemia, unspecified: Secondary | ICD-10-CM | POA: Insufficient documentation

## 2012-10-20 DIAGNOSIS — I10 Essential (primary) hypertension: Secondary | ICD-10-CM | POA: Insufficient documentation

## 2012-10-20 DIAGNOSIS — Z7982 Long term (current) use of aspirin: Secondary | ICD-10-CM | POA: Insufficient documentation

## 2012-10-20 DIAGNOSIS — W06XXXA Fall from bed, initial encounter: Secondary | ICD-10-CM | POA: Insufficient documentation

## 2012-10-20 DIAGNOSIS — W19XXXA Unspecified fall, initial encounter: Secondary | ICD-10-CM

## 2012-10-20 DIAGNOSIS — I2510 Atherosclerotic heart disease of native coronary artery without angina pectoris: Secondary | ICD-10-CM | POA: Insufficient documentation

## 2012-10-20 NOTE — ED Notes (Signed)
Pt found on floor at [facility] sent for eval. Related to possible fall no signs of obvious injury and pt denies pain on examination

## 2012-10-20 NOTE — ED Provider Notes (Signed)
CSN: [id number]     Arrival date & time 10/20/12  0353 History   First MD Initiated Contact with Patient 10/20/12 0354     Chief Complaint  Patient presents with  . Fall   (Consider location/radiation/quality/duration/timing/severity/associated sxs/prior Treatment) HPI Comments: Patient is an 79 year old male with past medical history significant for dementia, hypertension, coronary artery disease. He presents to the emergency department by EMS after a fall at the extended care facility. He apparently rolled off his bed onto a padded cushion that was beside the bed. EMS states he felt no further than 6 inches and the patient denies any symptoms. He denies any pain, shortness of breath, headache. He does have a history of dementia and history is somewhat limited due to this.  Patient is a 79 y.o. male presenting with fall. The history is provided by the patient.  Fall This is a new problem. The current episode started less than 1 hour ago. The problem occurs constantly. The problem has not changed since onset.Associated symptoms comments: none. Nothing aggravates the symptoms. Nothing relieves the symptoms. He has tried nothing for the symptoms. The treatment provided no relief.    Past Medical History  Diagnosis Date  . Thyroid disease   . Abdominal aneurysm   . Alzheimer's dementia   . Hypertension   . Renal disorder   . Bradycardia   . Hypokalemia   . BPH (benign prostatic hyperplasia)   . Pneumonia   . Coronary artery disease   . GERD (gastroesophageal reflux disease)   . Hyperlipidemia    Past Surgical History  Procedure Laterality Date  . Coronary artery bypass graft     No family history on file. History  Substance Use Topics  . Smoking status: Not on file  . Smokeless tobacco: Not on file  . Alcohol Use: No    Review of Systems  All other systems reviewed and are negative.    Allergies  Review of patient's allergies indicates no known allergies.  Home  Medications   Current Outpatient Rx  Name  Route  Sig  Dispense  Refill  . aspirin 81 MG tablet   Oral   Take 81 mg by mouth daily.         Marland Kitchen atorvastatin (LIPITOR) 10 MG tablet   Oral   Take 10 mg by mouth daily.         . calcium-vitamin D (OSCAL WITH D) 500-200 MG-UNIT per tablet   Oral   Take 2 tablets by mouth daily.         . Cholecalciferol (VITAMIN D-3 PO)   Oral   Take by mouth.         . ciprofloxacin (CIPRO) 500 MG tablet   Oral   Take 1 tablet (500 mg total) by mouth 2 (two) times daily. One po bid x 7 days   14 tablet   0   . divalproex (DEPAKOTE SPRINKLE) 125 MG capsule   Oral   Take 125 mg by mouth 2 (two) times daily.         . Eyelid Cleansers (OCUSOFT LID SCRUB EX)   Apply externally   Apply topically.         . finasteride (PROSCAR) 5 MG tablet   Oral   Take 5 mg by mouth daily.         . fluticasone (FLONASE) 50 MCG/ACT nasal spray   Nasal   Place 2 sprays into the nose daily.         Marland Kitchen  levofloxacin (LEVAQUIN) 500 MG tablet   Oral   Take 500 mg by mouth daily.         Marland Kitchen loperamide (IMODIUM) 2 MG capsule   Oral   Take 2 mg by mouth as needed for diarrhea or loose stools.         Marland Kitchen loratadine (CLARITIN) 10 MG tablet   Oral   Take 10 mg by mouth daily.         . metoprolol succinate (TOPROL-XL) 25 MG 24 hr tablet   Oral   Take 12.5 mg by mouth daily.         . metoprolol tartrate (LOPRESSOR) 25 MG tablet   Oral   Take 25 mg by mouth 2 (two) times daily.         . Multiple Vitamin (MULTIVITAMIN) capsule   Oral   Take 1 capsule by mouth daily.         . mupirocin cream (BACTROBAN) 2 %   Topical   Apply topically 3 (three) times daily.   15 g   0   . omeprazole (PRILOSEC) 20 MG capsule   Oral   Take 20 mg by mouth daily.         . pantoprazole (PROTONIX) 40 MG tablet   Oral   Take 40 mg by mouth daily.         . pentoxifylline (TRENTAL) 400 MG CR tablet   Oral   Take 400 mg by mouth 3  (three) times daily with meals.         . potassium chloride (K-DUR,KLOR-CON) 10 MEQ tablet   Oral   Take 10 mEq by mouth 2 (two) times daily.         . pravastatin (PRAVACHOL) 40 MG tablet   Oral   Take 40 mg by mouth daily.         Marland Kitchen venlafaxine (EFFEXOR) 37.5 MG tablet   Oral   Take 37.5 mg by mouth 2 (two) times daily.          There were no vitals taken for this visit. Physical Exam  Nursing note and vitals reviewed. Constitutional: He appears well-developed and well-nourished. No distress.  HENT:  Head: Normocephalic and atraumatic.  Mouth/Throat: Oropharynx is clear and moist.  Eyes: EOM are normal. Pupils are equal, round, and reactive to light.  Neck: Normal range of motion. Neck supple.  There is no cervical spine ttp, no stepoffs.  Cardiovascular: Normal rate, regular rhythm and normal heart sounds.   No murmur heard. Pulmonary/Chest: Effort normal and breath sounds normal. No respiratory distress. He has no wheezes.  Abdominal: Soft. Bowel sounds are normal. He exhibits no distension. There is no tenderness.  Musculoskeletal: Normal range of motion. He exhibits no edema.  All extremities are without deformity or swelling. He is able to move his shoulders elbows and wrists without discomfort. He has full range of motion of his hips, knees and ankles.   Lymphadenopathy:    He has no cervical adenopathy.  Neurological: He is alert. No cranial nerve deficit. He exhibits normal muscle tone. Coordination normal.  Patient is alert, awake, but is not oriented to place or situation.  Skin: Skin is warm and dry. He is not diaphoretic.    ED Course  Procedures (including critical care time) Labs Review Labs Reviewed - No data to display Imaging Review No results found.  MDM  No diagnosis found. Patient was brought here after a fall that occurred at the extended  care facility. I am told that he rolled out of bed and fell 6 inches onto a pad that was designed to  soften his landing should he fell out of bed. He has no complaints and there is no evidence for any obvious injury on physical exam. I do not feel as though any imaging is indicated as he has no discomfort and no complaints. He will be discharged to the extended care facility to return as needed for any problems. I was told by EMS that he was only sent here to to the protocol of the nursing home to have all falls evaluated by a physician.    Geoffery Lyons, MD 10/20/12 [phone]

## 2012-10-20 NOTE — ED Notes (Signed)
Report called to Clara LPN at [facility] called for return transport

## 2013-06-17 ENCOUNTER — Other Ambulatory Visit (HOSPITAL_COMMUNITY): Payer: Self-pay | Admitting: Geriatric Medicine

## 2013-06-17 DIAGNOSIS — R059 Cough, unspecified: Secondary | ICD-10-CM

## 2013-06-17 DIAGNOSIS — R05 Cough: Secondary | ICD-10-CM

## 2013-06-17 DIAGNOSIS — G20 Parkinson's disease: Secondary | ICD-10-CM

## 2013-06-21 ENCOUNTER — Ambulatory Visit (HOSPITAL_COMMUNITY)
Admission: RE | Admit: 2013-06-21 | Discharge: 2013-06-21 | Disposition: A | Discharge status: Home / Self Care | Payer: Medicare Other | Source: Ambulatory Visit | Attending: Geriatric Medicine | Admitting: Geriatric Medicine

## 2013-06-21 DIAGNOSIS — R1310 Dysphagia, unspecified: Secondary | ICD-10-CM | POA: Diagnosis present

## 2013-06-21 DIAGNOSIS — R1311 Dysphagia, oral phase: Secondary | ICD-10-CM | POA: Diagnosis not present

## 2013-06-21 DIAGNOSIS — G20A1 Parkinson's disease without dyskinesia, without mention of fluctuations: Secondary | ICD-10-CM | POA: Insufficient documentation

## 2013-06-21 DIAGNOSIS — G20 Parkinson's disease: Secondary | ICD-10-CM

## 2013-06-21 DIAGNOSIS — R1313 Dysphagia, pharyngeal phase: Secondary | ICD-10-CM | POA: Diagnosis not present

## 2013-06-21 DIAGNOSIS — Z951 Presence of aortocoronary bypass graft: Secondary | ICD-10-CM | POA: Insufficient documentation

## 2013-06-21 DIAGNOSIS — R059 Cough, unspecified: Secondary | ICD-10-CM | POA: Insufficient documentation

## 2013-06-21 DIAGNOSIS — I2510 Atherosclerotic heart disease of native coronary artery without angina pectoris: Secondary | ICD-10-CM | POA: Insufficient documentation

## 2013-06-21 DIAGNOSIS — R634 Abnormal weight loss: Secondary | ICD-10-CM | POA: Insufficient documentation

## 2013-06-21 DIAGNOSIS — K219 Gastro-esophageal reflux disease without esophagitis: Secondary | ICD-10-CM | POA: Insufficient documentation

## 2013-06-21 DIAGNOSIS — I129 Hypertensive chronic kidney disease with stage 1 through stage 4 chronic kidney disease, or unspecified chronic kidney disease: Secondary | ICD-10-CM | POA: Insufficient documentation

## 2013-06-21 DIAGNOSIS — R05 Cough: Secondary | ICD-10-CM

## 2013-06-21 DIAGNOSIS — N182 Chronic kidney disease, stage 2 (mild): Secondary | ICD-10-CM | POA: Insufficient documentation

## 2013-06-21 NOTE — Procedures (Signed)
Objective Swallowing Evaluation: Modified Barium Swallowing Study  Patient Details  Name: Wyatt Rodriguez MRN: [id number] Date of Birth: 05/11/1930  Today's Date: 06/21/2013 Time: 1125-1140 SLP Time Calculation (min): 15 min  Past Medical History:  Past Medical History  Diagnosis Date  . Thyroid disease   . Abdominal aneurysm   . Alzheimer's dementia   . Hypertension   . Renal disorder   . Bradycardia   . Hypokalemia   . BPH (benign prostatic hyperplasia)   . Pneumonia   . Coronary artery disease   . GERD (gastroesophageal reflux disease)   . Hyperlipidemia    Past Surgical History:  Past Surgical History  Procedure Laterality Date  . Coronary artery bypass graft     HPI:  80 year old male seen for OP MBS due to primary SLP reports of coughing and wet vocal quality with pos. PMH of Parkinson's disease, OA, heart disease, HTN with CKD stage 2, abnormal weight loss, CAD, s/p CABG, GERD, hx PNA, bradycardia.      Assessment / Plan / Recommendation Clinical Impression  Dysphagia Diagnosis: Mild oral phase dysphagia;Mild pharyngeal phase dysphagia Clinical impression: Patient presents with a mild oropharyngeal dysphagia characterized primarily by occassional delay in swallow initiation, primarily with thin liquids, however with full airway protection. Mild oral weakness noted due to generalized weakness resulting in mild oral residuals which could increase aspiration risk however overall swallow functional at this time for continuation of current diet.     Treatment Recommendation  Defer treatment plan to SLP at (Comment) (SNF)    Diet Recommendation Dysphagia 3 (Mechanical Soft);Thin liquid   Liquid Administration via: Cup;Straw Medication Administration: Crushed with puree Compensations: Slow rate;Small sips/bites Postural Changes and/or Swallow Maneuvers: Seated upright 90 degrees    Other  Recommendations Oral Care Recommendations: Oral care BID   Follow Up  Recommendations  Skilled Nursing facility            General HPI: 80 year old male seen for OP MBS due to primary SLP reports of coughing and wet vocal quality with pos. PMH of Parkinson's disease, OA, heart disease, HTN with CKD stage 2, abnormal weight loss, CAD, s/p CABG, GERD, hx PNA, bradycardia.  Type of Study: Modified Barium Swallowing Study Reason for Referral: Objectively evaluate swallowing function Diet Prior to this Study: Dysphagia 3 (soft);Thin liquids Temperature Spikes Noted: No Respiratory Status: Room air History of Recent Intubation: No Behavior/Cognition: Alert;Cooperative;Pleasant mood;Confused Oral Cavity - Dentition: Adequate natural dentition Oral Motor / Sensory Function:  (NT formally due to AMS) Self-Feeding Abilities: Needs assist Patient Positioning: Upright in chair Baseline Vocal Quality: Clear;Low vocal intensity Volitional Cough: Cognitively unable to elicit Volitional Swallow: Unable to elicit Anatomy: Within functional limits Pharyngeal Secretions: Not observed secondary MBS    Reason for Referral Objectively evaluate swallowing function   Oral Phase Oral Preparation/Oral Phase Oral Phase: Impaired Oral - Nectar Oral - Nectar Cup: Lingual/palatal residue Oral - Thin Oral - Thin Cup: Lingual/palatal residue Oral - Solids Oral - Puree: Lingual/palatal residue Oral - Mechanical Soft: Lingual/palatal residue   Pharyngeal Phase Pharyngeal Phase Pharyngeal Phase: Impaired Pharyngeal - Nectar Pharyngeal - Nectar Cup: Premature spillage to valleculae;Delayed swallow initiation Pharyngeal - Thin Pharyngeal - Thin Cup: Delayed swallow initiation;Premature spillage to valleculae Pharyngeal - Thin Straw: Delayed swallow initiation;Premature spillage to pyriform sinuses Pharyngeal - Solids Pharyngeal - Puree: Delayed swallow initiation;Premature spillage to valleculae Pharyngeal - Mechanical Soft: Delayed swallow initiation;Premature spillage to  valleculae Pharyngeal - Pill: Delayed swallow initiation;Premature spillage to  valleculae  Cervical Esophageal Phase    GO    Cervical Esophageal Phase Cervical Esophageal [facility]: [facility]    Functional Assessment Tool Used: skilled clinical judgement Functional Limitations: Swallowing Swallow Current Status ([id number]([id number]): At least 1 percent but less than 20 percent impaired, limited or restricted Swallow Goal Status [phone]([id number]): At least 1 percent but less than 20 percent impaired, limited or restricted Swallow Discharge Status [phone](G8998): At least 1 percent but less than 20 percent impaired, limited or restricted   [facility] Tobenna Needs MA, CCC-SLP [phone]  Vivi FernsLeah Meryl Cinzia Devos 06/21/2013, 4:27 PM

## 2014-01-01 ENCOUNTER — Encounter (HOSPITAL_BASED_OUTPATIENT_CLINIC_OR_DEPARTMENT_OTHER): Payer: Self-pay | Admitting: *Deleted

## 2014-01-01 ENCOUNTER — Emergency Department (HOSPITAL_BASED_OUTPATIENT_CLINIC_OR_DEPARTMENT_OTHER)
Admission: EM | Admit: 2014-01-01 | Discharge: 2014-01-02 | Disposition: A | Discharge status: Home / Self Care | Payer: Medicare Other | Attending: Emergency Medicine | Admitting: Emergency Medicine

## 2014-01-01 DIAGNOSIS — N400 Enlarged prostate without lower urinary tract symptoms: Secondary | ICD-10-CM | POA: Diagnosis not present

## 2014-01-01 DIAGNOSIS — Y9389 Activity, other specified: Secondary | ICD-10-CM | POA: Insufficient documentation

## 2014-01-01 DIAGNOSIS — W1839XA Other fall on same level, initial encounter: Secondary | ICD-10-CM | POA: Diagnosis not present

## 2014-01-01 DIAGNOSIS — Y998 Other external cause status: Secondary | ICD-10-CM | POA: Insufficient documentation

## 2014-01-01 DIAGNOSIS — Y9289 Other specified places as the place of occurrence of the external cause: Secondary | ICD-10-CM | POA: Diagnosis not present

## 2014-01-01 DIAGNOSIS — I10 Essential (primary) hypertension: Secondary | ICD-10-CM | POA: Insufficient documentation

## 2014-01-01 DIAGNOSIS — E876 Hypokalemia: Secondary | ICD-10-CM | POA: Diagnosis not present

## 2014-01-01 DIAGNOSIS — Z951 Presence of aortocoronary bypass graft: Secondary | ICD-10-CM | POA: Insufficient documentation

## 2014-01-01 DIAGNOSIS — Z87448 Personal history of other diseases of urinary system: Secondary | ICD-10-CM | POA: Diagnosis not present

## 2014-01-01 DIAGNOSIS — Z79899 Other long term (current) drug therapy: Secondary | ICD-10-CM | POA: Diagnosis not present

## 2014-01-01 DIAGNOSIS — W19XXXA Unspecified fall, initial encounter: Secondary | ICD-10-CM

## 2014-01-01 DIAGNOSIS — I2510 Atherosclerotic heart disease of native coronary artery without angina pectoris: Secondary | ICD-10-CM | POA: Insufficient documentation

## 2014-01-01 DIAGNOSIS — Z043 Encounter for examination and observation following other accident: Secondary | ICD-10-CM | POA: Insufficient documentation

## 2014-01-01 DIAGNOSIS — K219 Gastro-esophageal reflux disease without esophagitis: Secondary | ICD-10-CM | POA: Diagnosis not present

## 2014-01-01 DIAGNOSIS — I714 Abdominal aortic aneurysm, without rupture: Secondary | ICD-10-CM | POA: Insufficient documentation

## 2014-01-01 DIAGNOSIS — E785 Hyperlipidemia, unspecified: Secondary | ICD-10-CM | POA: Insufficient documentation

## 2014-01-01 DIAGNOSIS — Z7982 Long term (current) use of aspirin: Secondary | ICD-10-CM | POA: Insufficient documentation

## 2014-01-01 DIAGNOSIS — Z8701 Personal history of pneumonia (recurrent): Secondary | ICD-10-CM | POA: Insufficient documentation

## 2014-01-01 DIAGNOSIS — Z792 Long term (current) use of antibiotics: Secondary | ICD-10-CM | POA: Diagnosis not present

## 2014-01-01 DIAGNOSIS — G309 Alzheimer's disease, unspecified: Secondary | ICD-10-CM | POA: Insufficient documentation

## 2014-01-01 DIAGNOSIS — F0280 Dementia in other diseases classified elsewhere without behavioral disturbance: Secondary | ICD-10-CM | POA: Insufficient documentation

## 2014-01-01 NOTE — ED Notes (Addendum)
[facility] [address] H/o dementia. DNR on chart. Here by EMS s/p unwitnessed fall. Pt "slipped on his roommate's urine on b/r floor". Pt denies pain or injury. No obvious pain or injuries noted. Alert, NAD, calm, interactive, speech clear, tracking, skin intact, MAEx4. "Intermittent shaking and stuttering reported as normal". Also states, "pt is unable to report pain consistently when he has had it in the past". HC POA, DNR & MAR on chart.

## 2014-01-01 NOTE — ED Provider Notes (Signed)
CSN: [id number]     Arrival date & time 01/01/14  2335 History  This chart was scribed for Eylin Pontarelli Smitty CordsK Philmore Lepore-Rasch, MD by Ronney LionSuzanne Le, ED Scribe. This patient was seen in room MH04/MH04 and the patient's care was started at 11:56 PM.     Chief Complaint  Patient presents with  . Fall    Patient is a 80 y.o. male presenting with fall. The history is provided by the EMS personnel. The history is limited by the condition of the patient. No language interpreter was used.  Fall This is a new problem. The problem occurs constantly. The problem has not changed since onset.Nothing aggravates the symptoms. Nothing relieves the symptoms. He has tried nothing for the symptoms. The treatment provided no relief.    LEVEL 5 CAVEAT DUE TO DEMENTIA  HPI Comments: Wyatt Rodriguez is a 80 y.o. male brought in to the Emergency Department by EMS for a fall. Patient states that he feels great.   Past Medical History  Diagnosis Date  . Thyroid disease   . Abdominal aneurysm   . Alzheimer's dementia   . Hypertension   . Renal disorder   . Bradycardia   . Hypokalemia   . BPH (benign prostatic hyperplasia)   . Pneumonia   . Coronary artery disease   . GERD (gastroesophageal reflux disease)   . Hyperlipidemia    Past Surgical History  Procedure Laterality Date  . Coronary artery bypass graft     History reviewed. No pertinent family history. History  Substance Use Topics  . Smoking status: Never Smoker   . Smokeless tobacco: Not on file  . Alcohol Use: No    Review of Systems  Unable to perform ROS     Allergies  Review of patient's allergies indicates no known allergies.  Home Medications   Prior to Admission medications   Medication Sig Start Date End Date Taking? Authorizing Provider  aspirin 81 MG tablet Take 81 mg by mouth daily.    Historical Provider, MD  atorvastatin (LIPITOR) 10 MG tablet Take 10 mg by mouth daily.    Historical Provider, MD  calcium-vitamin D (OSCAL WITH D)  500-200 MG-UNIT per tablet Take 2 tablets by mouth daily.    Historical Provider, MD  Cholecalciferol (VITAMIN D-3 PO) Take by mouth.    Historical Provider, MD  ciprofloxacin (CIPRO) 500 MG tablet Take 1 tablet (500 mg total) by mouth 2 (two) times daily. One po bid x 7 days 09/19/12   Rolan BuccoMelanie Belfi, MD  divalproex (DEPAKOTE SPRINKLE) 125 MG capsule Take 125 mg by mouth 2 (two) times daily.    Historical Provider, MD  divalproex (DEPAKOTE) 250 MG DR tablet Take 250 mg by mouth 2 (two) times daily.    Historical Provider, MD  Eyelid Cleansers (OCUSOFT LID SCRUB EX) Apply topically.    Historical Provider, MD  finasteride (PROSCAR) 5 MG tablet Take 5 mg by mouth daily.    Historical Provider, MD  fluticasone (FLONASE) 50 MCG/ACT nasal spray Place 2 sprays into the nose daily.    Historical Provider, MD  levofloxacin (LEVAQUIN) 500 MG tablet Take 500 mg by mouth daily.    Historical Provider, MD  loperamide (IMODIUM) 2 MG capsule Take 2 mg by mouth as needed for diarrhea or loose stools.    Historical Provider, MD  loratadine (CLARITIN) 10 MG tablet Take 10 mg by mouth daily.    Historical Provider, MD  metoprolol succinate (TOPROL-XL) 25 MG 24 hr tablet Take 12.5 mg  by mouth daily.    Historical Provider, MD  metoprolol tartrate (LOPRESSOR) 25 MG tablet Take 25 mg by mouth every morning.     Historical Provider, MD  Multiple Vitamin (MULTIVITAMIN) capsule Take 1 capsule by mouth daily.    Historical Provider, MD  mupirocin cream (BACTROBAN) 2 % Apply topically 3 (three) times daily. 09/19/12   Rolan BuccoMelanie Belfi, MD  omeprazole (PRILOSEC) 20 MG capsule Take 20 mg by mouth daily.    Historical Provider, MD  pantoprazole (PROTONIX) 40 MG tablet Take 40 mg by mouth daily.    Historical Provider, MD  pentoxifylline (TRENTAL) 400 MG CR tablet Take 400 mg by mouth every morning.     Historical Provider, MD  potassium chloride (K-DUR,KLOR-CON) 10 MEQ tablet Take 10 mEq by mouth daily.     Historical Provider, MD   pravastatin (PRAVACHOL) 40 MG tablet Take 40 mg by mouth daily.    Historical Provider, MD  risperiDONE (RISPERDAL) 0.5 MG tablet Take 0.5 mg by mouth daily.    Historical Provider, MD  venlafaxine (EFFEXOR) 37.5 MG tablet Take 37.5 mg by mouth 2 (two) times daily.    Historical Provider, MD   BP 117/79 mmHg  Pulse 74  Temp(Src) 97.6 F (36.4 C) (Oral)  Resp 18  SpO2 99% Physical Exam  Constitutional: He appears well-developed and well-nourished.  HENT:  Head: Normocephalic. Head is without raccoon's eyes and without Battle's sign.  Right Ear: Tympanic membrane and external ear normal. No mastoid tenderness. No hemotympanum.  Left Ear: Tympanic membrane and external ear normal. No mastoid tenderness. No hemotympanum.  Mouth/Throat: Oropharynx is clear and moist. No oropharyngeal exudate.  No Battle's sign or raccoon eyes. No hemotympanum. No cephalohematomas.  Eyes: EOM are normal. Pupils are equal, round, and reactive to light.  Neck: Normal range of motion. Neck supple.  Cardiovascular: Normal rate, regular rhythm and normal heart sounds.   Pulmonary/Chest: Breath sounds normal. No respiratory distress. He has no wheezes. He has no rales.  Abdominal: Soft. Bowel sounds are normal. There is no tenderness. There is no rebound and no guarding.  Musculoskeletal: Normal range of motion. He exhibits no edema or tenderness.  Pelvis is stable.   Neurological: He is alert. He has normal reflexes.  Skin: Skin is warm and dry.  No abrasions.  Nursing note and vitals reviewed.   ED Course  Procedures (including critical care time)  DIAGNOSTIC STUDIES: Oxygen Saturation is 99% on room air, normal by my interpretation.    Labs Review Labs Reviewed - No data to display  Imaging Review No results found.   EKG Interpretation None      MDM   Final diagnoses:  None    Follow up with your PMD for ongoing care I personally performed the services described in this  documentation, which was scribed in my presence. The recorded information has been reviewed and is accurate.   Jasmine AweApril K Waino Mounsey-Rasch, MD 01/02/14 [phone]

## 2014-01-02 ENCOUNTER — Encounter (HOSPITAL_BASED_OUTPATIENT_CLINIC_OR_DEPARTMENT_OTHER): Payer: Self-pay | Admitting: Emergency Medicine

## 2014-01-02 ENCOUNTER — Emergency Department (HOSPITAL_BASED_OUTPATIENT_CLINIC_OR_DEPARTMENT_OTHER): Payer: Medicare Other

## 2014-01-02 DIAGNOSIS — Z043 Encounter for examination and observation following other accident: Secondary | ICD-10-CM | POA: Diagnosis not present

## 2014-01-02 NOTE — Discharge Instructions (Signed)

## 2014-01-02 NOTE — ED Notes (Signed)
[facility] with [facility] and states bed is ready bed. [facility] contacted for transport.

## 2014-01-02 NOTE — ED Notes (Signed)
Pt suffering from severe dementia and unable to sign discharge papers. [facility]. Pt being transported via [facility]. Condition stable

## 2014-01-06 ENCOUNTER — Other Ambulatory Visit: Payer: Self-pay | Admitting: *Deleted

## 2014-01-06 MED ORDER — TRAMADOL HCL 50 MG PO TABS: ORAL_TABLET | ORAL | Status: AC

## 2014-01-06 NOTE — Telephone Encounter (Signed)
[facility] 

## 2014-01-11 ENCOUNTER — Other Ambulatory Visit: Payer: Self-pay | Admitting: *Deleted

## 2014-01-11 MED ORDER — ALPRAZOLAM 0.5 MG PO TABS: ORAL_TABLET | ORAL | Status: AC

## 2014-01-11 NOTE — Telephone Encounter (Signed)
[facility] 

## 2014-02-07 ENCOUNTER — Non-Acute Institutional Stay (SKILLED_NURSING_FACILITY): Payer: Medicare Other | Admitting: Nurse Practitioner

## 2014-02-07 DIAGNOSIS — N400 Enlarged prostate without lower urinary tract symptoms: Secondary | ICD-10-CM | POA: Insufficient documentation

## 2014-02-07 DIAGNOSIS — J309 Allergic rhinitis, unspecified: Secondary | ICD-10-CM

## 2014-02-07 DIAGNOSIS — I10 Essential (primary) hypertension: Secondary | ICD-10-CM

## 2014-02-07 DIAGNOSIS — I25810 Atherosclerosis of coronary artery bypass graft(s) without angina pectoris: Secondary | ICD-10-CM

## 2014-02-07 DIAGNOSIS — G309 Alzheimer's disease, unspecified: Secondary | ICD-10-CM

## 2014-02-07 DIAGNOSIS — F0280 Dementia in other diseases classified elsewhere without behavioral disturbance: Secondary | ICD-10-CM

## 2014-02-07 DIAGNOSIS — F411 Generalized anxiety disorder: Secondary | ICD-10-CM

## 2014-02-07 DIAGNOSIS — K219 Gastro-esophageal reflux disease without esophagitis: Secondary | ICD-10-CM

## 2014-02-07 DIAGNOSIS — I2510 Atherosclerotic heart disease of native coronary artery without angina pectoris: Secondary | ICD-10-CM | POA: Insufficient documentation

## 2014-02-07 NOTE — Progress Notes (Signed)
Patient ID: Wyatt Rodriguez, male   DOB: 08-05-1930, 81 y.o.   MRN: [id number]    Nursing Home Location:  [facility]   Place of Service: SNF (31)  PCP: Florentina Jenny, MD  No Known Allergies  Chief Complaint  Patient presents with  . Medical Management of Chronic Issues    HPI:  Patient is a 81 y.o. male seen today at [facility] for routine follow up. Pt was transferred from AL back in December possible due to needing a higher level of care due to advanced dementia. Pt with a pmh of htn with renal disease, allergic rhinitis, bph, weight loss, CAD s/p CABG, advanced dementia. Pt with minimal communication. Does not talk during exam. Staff reports he will yell out when anxious or in pain. Has been giving him xanax and tramadol as needed with good relief. Pt is apparently followed by community hospice but do not see any info in the chart.   Review of Systems:  Review of Systems  Unable to perform ROS: Dementia    Past Medical History  Diagnosis Date  . Thyroid disease   . Abdominal aneurysm   . Alzheimer's dementia   . Hypertension   . Renal disorder   . Bradycardia   . Hypokalemia   . BPH (benign prostatic hyperplasia)   . Pneumonia   . Coronary artery disease   . GERD (gastroesophageal reflux disease)   . Hyperlipidemia    Past Surgical History  Procedure Laterality Date  . Coronary artery bypass graft     Social History:   reports that he has never smoked. He does not have any smokeless tobacco history on file. He reports that he does not drink alcohol or use illicit drugs.  No family history on file.  Medications: Patient's Medications  New Prescriptions   No medications on file  Previous Medications   ALPRAZOLAM (XANAX) 0.5 MG TABLET    Take one tablet by mouth three times daily as needed for agitation   ASPIRIN 81 MG TABLET    Take 81 mg by mouth daily.   ATORVASTATIN (LIPITOR) 10 MG TABLET    Take 10 mg by mouth daily.   CALCIUM-VITAMIN D (OSCAL WITH D) 500-200 MG-UNIT PER TABLET    Take 2 tablets by mouth daily.   CARBIDOPA-LEVODOPA (SINEMET IR) 25-100 MG PER TABLET    Take 1 tablet by mouth 3 (three) times daily.   CHOLECALCIFEROL (VITAMIN D-3 PO)    Take by mouth.   CIPROFLOXACIN (CIPRO) 500 MG TABLET    Take 1 tablet (500 mg total) by mouth 2 (two) times daily. One po bid x 7 days   DIVALPROEX (DEPAKOTE SPRINKLE) 125 MG CAPSULE    Take 125 mg by mouth 2 (two) times daily.   DIVALPROEX (DEPAKOTE) 250 MG DR TABLET    Take 250 mg by mouth 2 (two) times daily.   EYELID CLEANSERS (OCUSOFT LID SCRUB EX)    Apply topically.   FINASTERIDE (PROSCAR) 5 MG TABLET    Take 5 mg by mouth daily.   FLUTICASONE (FLONASE) 50 MCG/ACT NASAL SPRAY    Place 2 sprays into the nose daily.   LEVOFLOXACIN (LEVAQUIN) 500 MG TABLET    Take 500 mg by mouth daily.   LOPERAMIDE (IMODIUM) 2 MG CAPSULE    Take 2 mg by mouth as needed for diarrhea or loose stools.   LORATADINE (CLARITIN) 10 MG TABLET    Take 10 mg by mouth daily.  METOPROLOL SUCCINATE (TOPROL-XL) 25 MG 24 HR TABLET    Take 12.5 mg by mouth daily.   METOPROLOL TARTRATE (LOPRESSOR) 25 MG TABLET    Take 25 mg by mouth every morning.    MULTIPLE VITAMIN (MULTIVITAMIN) CAPSULE    Take 1 capsule by mouth daily.   MUPIROCIN CREAM (BACTROBAN) 2 %    Apply topically 3 (three) times daily.   OMEPRAZOLE (PRILOSEC) 20 MG CAPSULE    Take 20 mg by mouth daily.   PANTOPRAZOLE (PROTONIX) 40 MG TABLET    Take 40 mg by mouth daily.   PENTOXIFYLLINE (TRENTAL) 400 MG CR TABLET    Take 400 mg by mouth every morning.    POTASSIUM CHLORIDE (K-DUR,KLOR-CON) 10 MEQ TABLET    Take 10 mEq by mouth daily.    PRAVASTATIN (PRAVACHOL) 40 MG TABLET    Take 40 mg by mouth daily.   RISPERIDONE (RISPERDAL) 0.5 MG TABLET    Take 0.5 mg by mouth daily.   TRAMADOL (ULTRAM) 50 MG TABLET    Take one tablet by mouth every 4 hours as needed for pain   VENLAFAXINE (EFFEXOR) 37.5 MG TABLET    Take 37.5 mg by mouth  2 (two) times daily.  Modified Medications   No medications on file  Discontinued Medications   No medications on file     Physical Exam: Filed Vitals:   02/07/14 1121  BP: 116/64  Pulse: 70  Temp: 96.7 F (35.9 C)  Resp: 18  Weight: 148 lb (67.132 kg)    Physical Exam  Constitutional: No distress.  Frail male  HENT:  Head: Normocephalic and atraumatic.  Mouth/Throat: Oropharynx is clear and moist. No oropharyngeal exudate.  Eyes: Conjunctivae and EOM are normal. Pupils are equal, round, and reactive to light.  Neck: Normal range of motion. Neck supple.  Cardiovascular: Normal rate, regular rhythm and normal heart sounds.   Pulmonary/Chest: Effort normal and breath sounds normal.  Abdominal: Soft. Bowel sounds are normal.  Musculoskeletal: He exhibits no edema or tenderness.  Neurological: He is alert.  Skin: Skin is warm and dry. He is not diaphoretic.  Psychiatric: Cognition and memory are impaired. He exhibits abnormal recent memory and abnormal remote memory.    Labs reviewed: Basic Metabolic Panel: No results for input(s): NA, K, CL, CO2, GLUCOSE, BUN, CREATININE, CALCIUM, MG, PHOS in the last 8760 hours. Liver Function Tests: No results for input(s): AST, ALT, ALKPHOS, BILITOT, PROT, ALBUMIN in the last 8760 hours. No results for input(s): LIPASE, AMYLASE in the last 8760 hours. No results for input(s): AMMONIA in the last 8760 hours. CBC: No results for input(s): WBC, NEUTROABS, HGB, HCT, MCV, PLT in the last 8760 hours. TSH: No results for input(s): TSH in the last 8760 hours. A1C: No results found for: HGBA1C Lipid Panel: No results for input(s): CHOL, HDL, LDLCALC, TRIG, CHOLHDL, LDLDIRECT in the last 8760 hours.  CBC NO Diff (Complete Blood Count)    Result: 01/10/2014 11:28 AM   ( Status: F )       WBC 6.9     4.0-10.5 K/uL SLN   RBC 3.55   L 4.22-5.81 MIL/uL SLN   Hemoglobin 11.8   L 13.0-17.0 g/dL SLN   Hematocrit 40.934.4   L 39.0-52.0 % SLN    MCV 96.9     78.0-100.0 fL SLN   MCH 33.2     26.0-34.0 pg SLN   MCHC 34.3     30.0-36.0 g/dL SLN   RDW 13.0  11.5-15.5 % SLN   Platelet Count 174     150-400 K/uL SLN   MPV 9.6     9.4-12.4 fL SLN   Basic Metabolic Panel    Result: 01/10/2014 11:59 AM   ( Status: F )       Sodium 141     135-145 mEq/L SLN   Potassium 4.2     3.5-5.3 mEq/L SLN   Chloride 107     96-112 mEq/L SLN   CO2 27     19-32 mEq/L SLN   Glucose 98     70-99 mg/dL SLN   BUN 23     1-61 mg/dL SLN   Creatinine 0.96   H 0.50-1.35 mg/dL SLN   Calcium 9.2     0.4-54.0 mg/dL SLN   Lipid Panel    Result: 01/10/2014 11:59 AM   ( Status: F )       Cholesterol 146     0-200 mg/dL SLN C Triglyceride 981     <150 mg/dL SLN   HDL Cholesterol 31   L >39 mg/dL SLN   Total Chol/HDL Ratio 4.7      Ratio SLN   VLDL Cholesterol (Calc) 20     0-40 mg/dL SLN   LDL Cholesterol (Calc) 95     0-99 mg/dL SLN C Valproic Acid (Depakene)    Result: 01/10/2014 2:40 PM   ( Status: F )       Valproic Acid (Depakene) 19.9   L 50.0-100.0 ug/mL SLN   Vitamin D, 25-Hydroxy, Total    Result: 01/11/2014 12:08 AM   ( Status: F )       Vitamin D (25-Hydroxy) 38     30-100 ng/mL SLN Assessment/Plan 1. Alzheimer's dementia Has been followed by psych services at [place], which revealed he has titration of antipsychotic at previous facility. Currently taking depakote 250 mg daily and Risperdal 0.5 mg daily due to psychosis    2. Essential hypertension Stable on current regimen  3. BPH (benign prostatic hyperplasia) conts on proscar   4. Gastroesophageal reflux disease without esophagitis conts on omeprazole   5. Coronary artery disease involving coronary bypass graft of native heart without angina pectoris conts on trental, ASA, beta blocker   6. Allergic rhinitis, unspecified allergic rhinitis type conts flonase and claritin   7. Anxiety Xanax 0.5 mg PO TID PRN    Communication- spoke with SW, to get community hospice notes,  also to get medical records/recent notes from previous PCP (Dr Carolyn Stare office)

## 2014-02-08 ENCOUNTER — Non-Acute Institutional Stay (SKILLED_NURSING_FACILITY): Admitting: Internal Medicine

## 2014-02-08 DIAGNOSIS — G309 Alzheimer's disease, unspecified: Secondary | ICD-10-CM

## 2014-02-08 DIAGNOSIS — F0280 Dementia in other diseases classified elsewhere without behavioral disturbance: Secondary | ICD-10-CM

## 2014-02-08 DIAGNOSIS — R634 Abnormal weight loss: Secondary | ICD-10-CM

## 2014-02-09 NOTE — Progress Notes (Signed)
Patient ID: Wyatt Rodriguez, male   DOB: 07/09/1930, 81 y.o.   MRN: [id number]               PROGRESS NOTE  DATE:  02/08/2014                          [facility]                        LEVEL OF CARE:   SNF   Acute Visit   HISTORY OF PRESENT ILLNESS:  This is a gentleman who appears to have been admitted here last month.  [facility] [place], presumably due to inability to provide for his stricter care needs or perhaps this was a financial issue.  In any case, he appears to have been transferred here.     I note that he has already been seen by Psychiatry and already seen by our service yesterday.  His admission seems to have escaped my recognition.    Psychiatry thought that he had psychosis, but then dementia without behavioral disturbance.  His Risperdal was increased to 0.5 q.h.s.     He is also on [facility]osp[facility] care [facility]  He does have a DNR on the chart.  PHYSICAL EXAMINATION:   GENERAL APPEARANCE:   The patient is not in any distress.  He is contractured.  Seems to have difficulty holding his head up.   CHEST/RESPIRATORY:  Clear air entry bilaterally.    CARDIOVASCULAR:  CARDIAC:  He appears to be euvolemic.  Heart sounds are normal.   GASTROINTESTINAL:  ABDOMEN:   Soft.  There is no tenderness.  No masses.   GENITOURINARY:  BLADDER:   No bladder distention.   NEUROLOGICAL:    SENSATION/STRENGTH:  Extremities:  Marked flexion contractures.   TONE:  He has some increase in tone, although I am doubtful that this is Parkinson's disease.  He is on Sinemet.    ASSESSMENT/PLAN:                       Severe dementia.   I am assuming he is on Hospice for this.  Judging by the size of his pants, I would say he has lost a fair amount of weight, a four-pound weight loss since he has been in here, 152 to 148.     History of coronary artery disease.  Status post CABG.  He is on aspirin,  Lipitor.                BPH with chronic renal insufficiency.  He has a creatinine of 1.48.    Mild anemia with a hemoglobin of 11.8.    Hyperlipidemia.  LDL is at 95.  This is satisfactory.    Advanced dementia.  This could easily cause his parkinsonism.    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  On Risperdal and Depakote.     CPT CODE: [id number]

## 2014-02-21 ENCOUNTER — Other Ambulatory Visit: Payer: Self-pay | Admitting: *Deleted

## 2014-02-21 MED ORDER — LORAZEPAM 0.5 MG PO TABS: ORAL_TABLET | ORAL | Status: AC

## 2014-02-21 MED ORDER — AMBULATORY NON FORMULARY MEDICATION: Status: AC

## 2014-02-21 NOTE — Telephone Encounter (Signed)
[facility] 

## 2014-02-21 NOTE — Telephone Encounter (Signed)
[facility] 

## 2014-02-21 DEATH — deceased

## 2016-05-24 IMAGING — CT CT HEAD W/O CM
1 of 2 series · 14 of 30 positions shown, 18 images · non-contrast
Comparison: 09/30/2012

CLINICAL DATA: Unwitnessed fall.  Slipped and fall injury.

EXAM:
CT HEAD WITHOUT CONTRAST
TECHNIQUE: Contiguous axial images were obtained from the base of the skull
through the vertex without intravenous contrast.

[Series 2: head 4.8 h37s · axial · 0.53mm/px · z∈[-225,-83]mm · 14 of 36 slices shown, 18 images]
[im 3/36  brain]
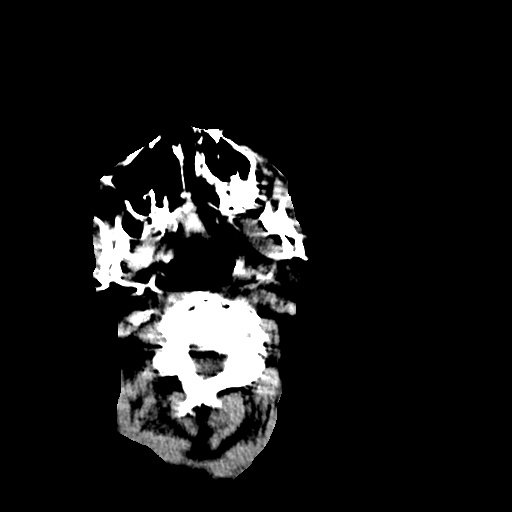
[im 3/36  bone]
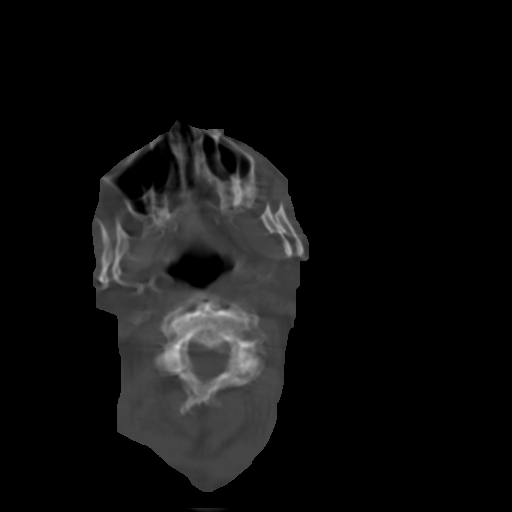
[im 5/36  brain]
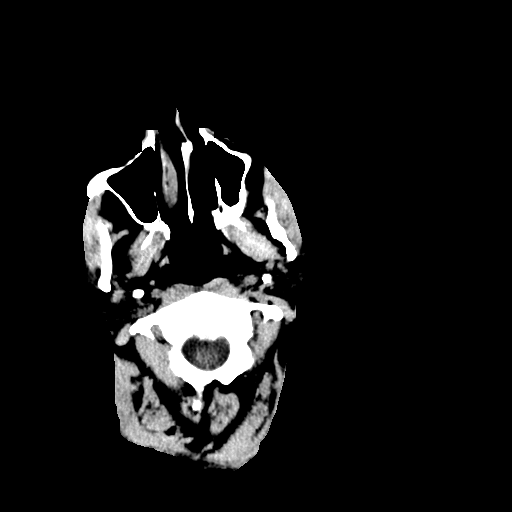
[im 8/36  brain]
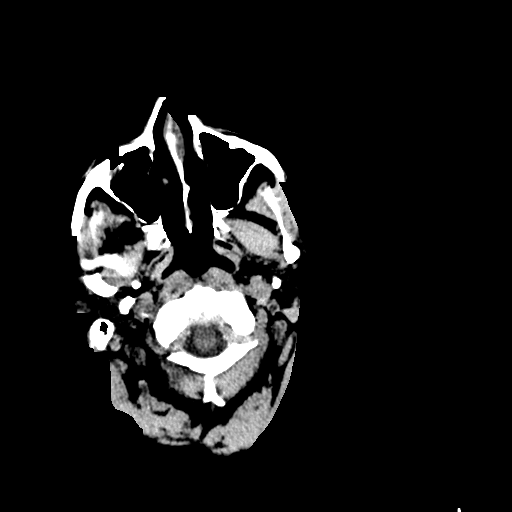
[im 10/36  brain]
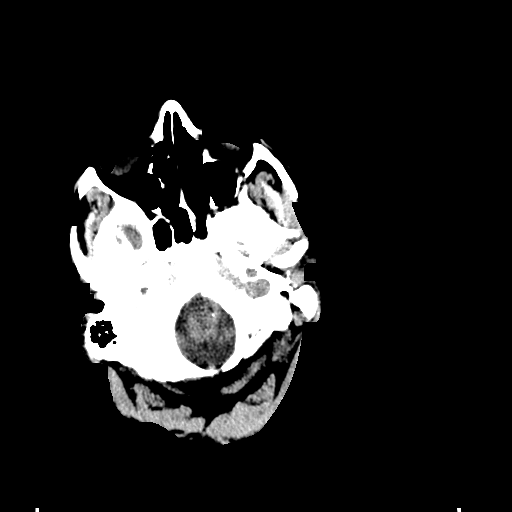
[im 12/36  brain]
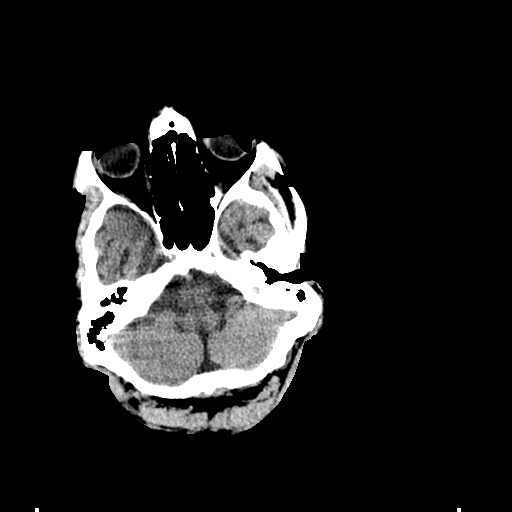
[im 12/36  bone]
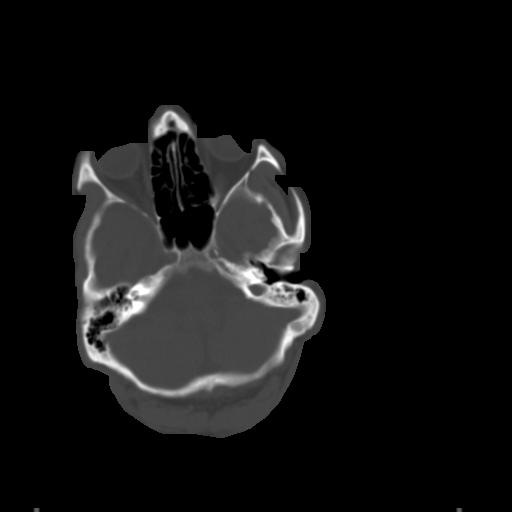
[im 15/36  brain]
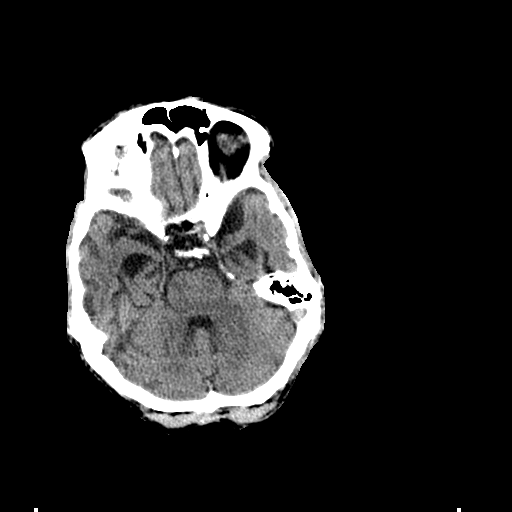
[im 17/36  brain]
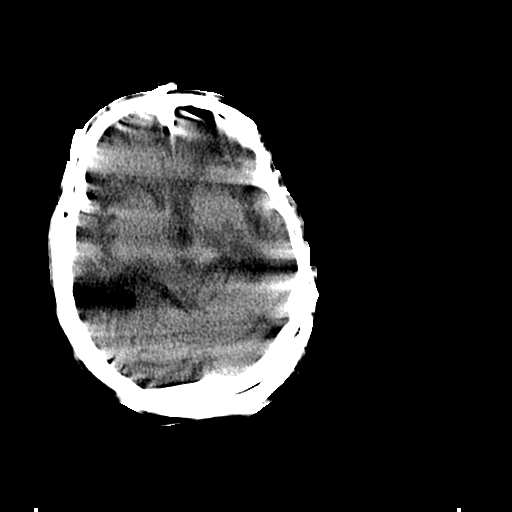
[im 19/36  brain]
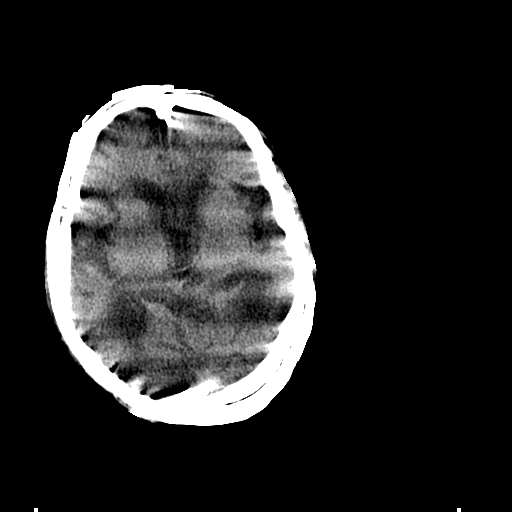
[im 22/36  brain]
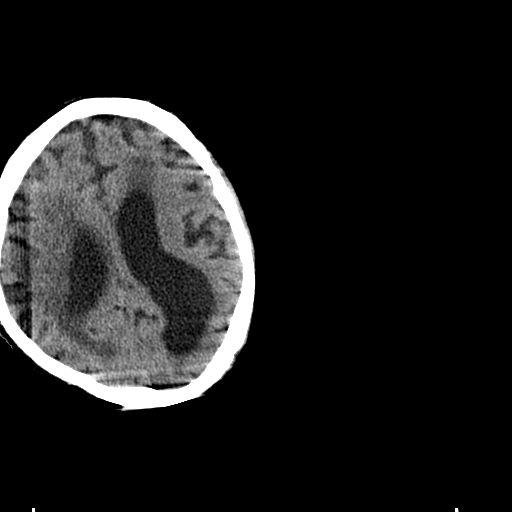
[im 22/36  bone]
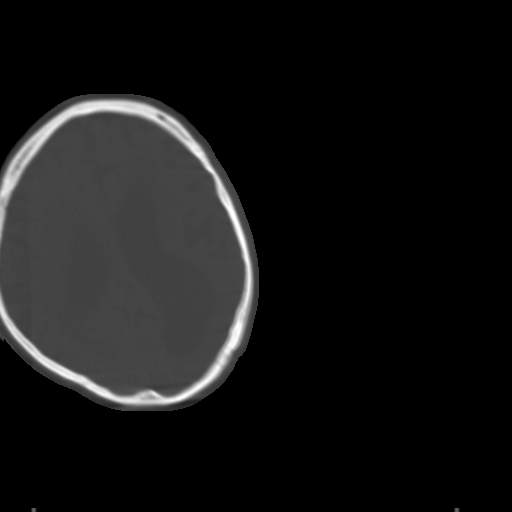
[im 24/36  brain]
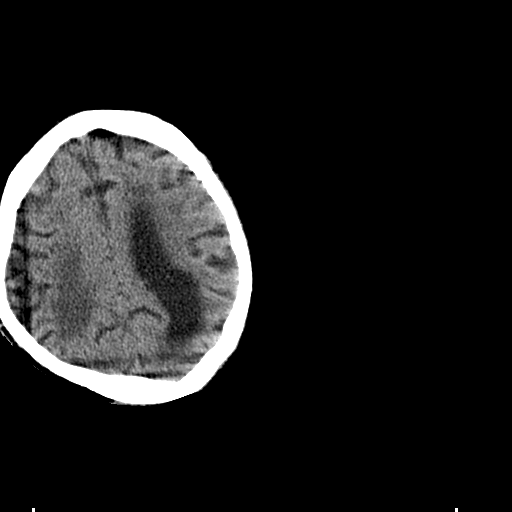
[im 26/36  brain]
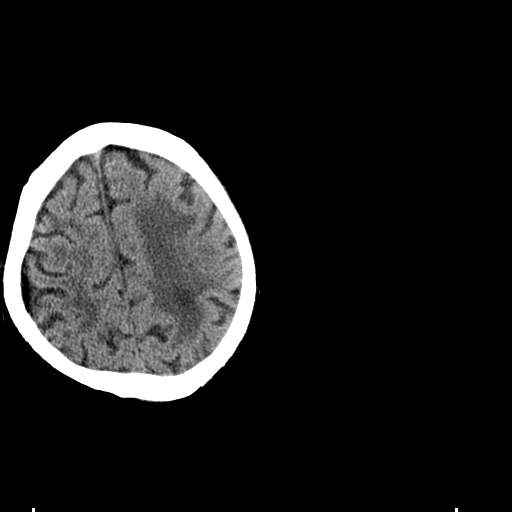
[im 29/36  brain]
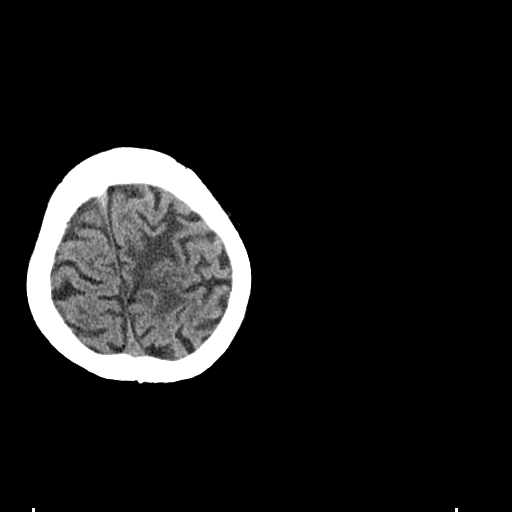
[im 31/36  brain]
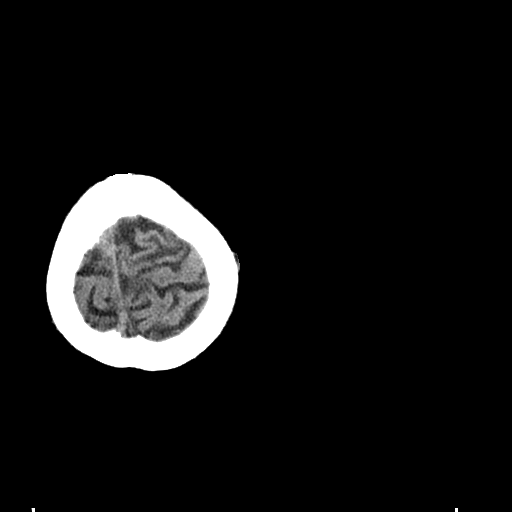
[im 31/36  bone]
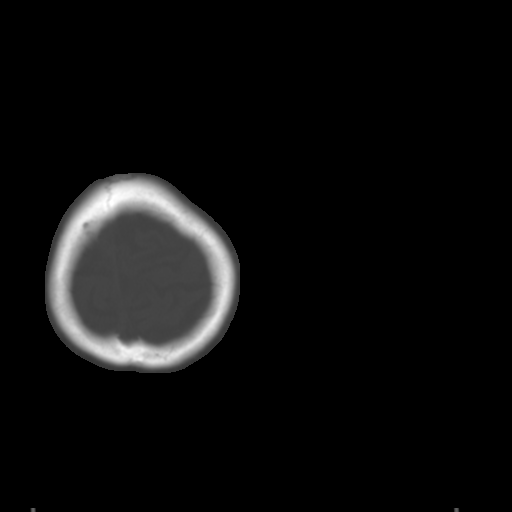
[im 33/36  brain]
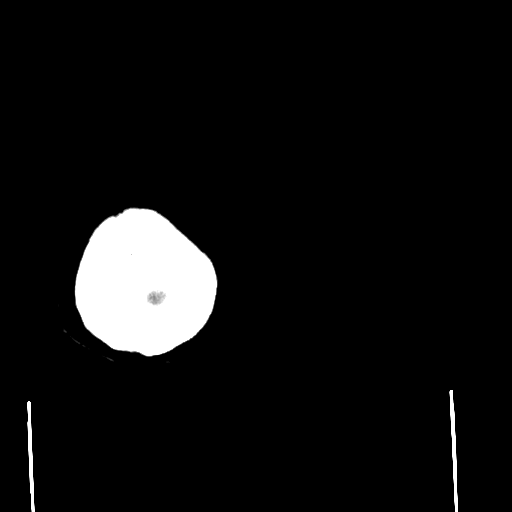

[14 of 30 positions shown; findings below may reference images not displayed]

FINDINGS: Diffuse cerebral atrophy. Ventricular dilatation consistent with
central atrophy. Low-attenuation changes in the deep white matter
consistent with small vessel ischemia. No mass effect or midline
shift. No abnormal extra-axial fluid collections. Gray-white matter
junctions are distinct. Basal cisterns are not effaced. No evidence
of acute intracranial hemorrhage. No depressed skull fractures.
Visualized paranasal sinuses and mastoid air cells are not
opacified. Vascular calcifications.
IMPRESSION: No acute intracranial abnormalities. Chronic atrophy and small
vessel ischemic changes. No change since previous study.
